# Patient Record
Sex: Male | Born: 1990 | Race: White | Hispanic: No | Marital: Single | Smoking: Never smoker
Health system: Southern US, Community
[De-identification: ages and names within clinical notes are randomized; demographics above are authoritative.]

## PROBLEM LIST (undated history)

## (undated) DIAGNOSIS — N471 Phimosis: Secondary | ICD-10-CM

## (undated) DIAGNOSIS — F419 Anxiety disorder, unspecified: Secondary | ICD-10-CM

## (undated) DIAGNOSIS — J4530 Mild persistent asthma, uncomplicated: Secondary | ICD-10-CM

## (undated) DIAGNOSIS — K219 Gastro-esophageal reflux disease without esophagitis: Secondary | ICD-10-CM

## (undated) HISTORY — PX: NO PAST SURGERIES: SHX2092

---

## 2015-09-21 ENCOUNTER — Encounter (HOSPITAL_COMMUNITY): Payer: Self-pay | Admitting: Psychology

## 2015-09-22 ENCOUNTER — Other Ambulatory Visit (HOSPITAL_COMMUNITY): Payer: BC Managed Care – PPO | Attending: Psychiatry

## 2015-09-23 ENCOUNTER — Other Ambulatory Visit (HOSPITAL_COMMUNITY): Payer: BC Managed Care – PPO

## 2015-09-24 ENCOUNTER — Telehealth (HOSPITAL_COMMUNITY): Payer: Self-pay | Admitting: Psychology

## 2015-09-24 ENCOUNTER — Other Ambulatory Visit (HOSPITAL_COMMUNITY): Payer: BC Managed Care – PPO

## 2015-09-24 NOTE — Progress Notes (Signed)
Craig Wiley is a 24 y.o. male patient. Orientation to CD-IOP: the patient is a 24 yo single, white, male seeking entry into the CD-IOP. The patient is currently living with his mother, step-father and 25 yo step-sister here in [place]. He recently moved here from [place], [place] where he has lived with his father for a number of years. 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 He was prescribed Concerta for ADD at that same age, but stated that he took it rarely. The patient explained he has struggled with anxiety for much of his life and the prescribed stimulant only made the anxiety worse.  The patient used cannabis daily (Cannabis is readily available for medicinal use in [place]) and binge drank at least 3-4 times per week. His problems escalated after he was prescribed Ativan for the anxiety at age 24. At one point, the patient was prescribed 8 mg of Ativan, QID, but somehow the script was mistakenly increased up at 16 mgs. The patient has been hospitalized twice for alcohol detox over the years and most recently, entered a facility in [place] to complete a benzodiazepine detox. [facility] on October 17th and was discharged 8 days later. The patient admitted he has taken about 4 Valium since late October, but has remained free of alcohol and cannabis since before entering [facility]. When asked about efforts to stop drinking and drugging, the patient reported he had stopped drinking for 2 months about a year ago. When questioned further, he admitted that his use of cannabis had increased markedly during that time. The patient has experienced many negative consequences as a result of his alcohol and drug use, including flunking out of school numerous times, losing jobs because of his drug use as well as ongoing problems with his family. He also has a pending DWI from over 2 years ago. The patient reported he had  blacked out for almost 24 hours after taking 4 mg of Ativan and woke up in jail with no memory of what he had done. Addiction is prevalent on both sides of his family. The patient's father is an alcoholic and addict as are numerous paternal uncles and his paternal grandmother. His maternal grandfather and great grandfather were also alcoholics. The patient's parent's divorced when he was 24 years old. He lived with his mother for most of his youth, but has been living with his father in [place] for a number of years. The patient attributes his growing use of chemicals to the anxiety that has followed him for much of his life. He is currently prescribed Celexa and Neurontin, but reported he still feels very anxious. He displayed good motivation and a willingness to do what the program asks of him. His mother and step-father are supportive of his efforts to remain drug-free. The documentation was reviewed, signed and completed accordingly. The patient will return tomorrow and begin the CD-IOP.         [name], LCAS

## 2015-09-24 NOTE — Progress Notes (Signed)
Catalina LungerMatthew Chouimard is a 24 y.o. male patient. CD-IOP: The patient was scheduled to begin the program today, but appears to be resisting treatment. He and his mother both contacted me this morning and I spoke with him and left message for her. 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 We will welcome him when he appears.         Khristian Phillippi, LCAS

## 2015-09-27 ENCOUNTER — Other Ambulatory Visit (HOSPITAL_COMMUNITY): Payer: BC Managed Care – PPO | Admitting: Medical

## 2015-09-28 ENCOUNTER — Other Ambulatory Visit (HOSPITAL_COMMUNITY): Payer: BC Managed Care – PPO

## 2015-09-29 ENCOUNTER — Other Ambulatory Visit (HOSPITAL_COMMUNITY): Payer: BC Managed Care – PPO

## 2015-09-30 ENCOUNTER — Other Ambulatory Visit (HOSPITAL_COMMUNITY): Payer: BC Managed Care – PPO

## 2015-10-01 ENCOUNTER — Other Ambulatory Visit (HOSPITAL_COMMUNITY): Payer: BC Managed Care – PPO

## 2015-10-04 ENCOUNTER — Other Ambulatory Visit (HOSPITAL_COMMUNITY): Payer: BC Managed Care – PPO

## 2015-10-05 ENCOUNTER — Other Ambulatory Visit (HOSPITAL_COMMUNITY): Payer: BC Managed Care – PPO

## 2015-10-06 ENCOUNTER — Encounter (HOSPITAL_COMMUNITY): Payer: Self-pay

## 2015-10-06 ENCOUNTER — Other Ambulatory Visit (HOSPITAL_COMMUNITY): Payer: BC Managed Care – PPO

## 2015-10-06 ENCOUNTER — Emergency Department (HOSPITAL_COMMUNITY): Payer: BC Managed Care – PPO

## 2015-10-06 ENCOUNTER — Emergency Department (HOSPITAL_COMMUNITY)
Admission: EM | Admit: 2015-10-06 | Discharge: 2015-10-06 | Disposition: A | Discharge status: Home / Self Care | Payer: BC Managed Care – PPO | Attending: Emergency Medicine | Admitting: Emergency Medicine

## 2015-10-06 DIAGNOSIS — F17200 Nicotine dependence, unspecified, uncomplicated: Secondary | ICD-10-CM | POA: Diagnosis not present

## 2015-10-06 DIAGNOSIS — Z7951 Long term (current) use of inhaled steroids: Secondary | ICD-10-CM | POA: Diagnosis not present

## 2015-10-06 DIAGNOSIS — Z79899 Other long term (current) drug therapy: Secondary | ICD-10-CM | POA: Insufficient documentation

## 2015-10-06 DIAGNOSIS — F419 Anxiety disorder, unspecified: Secondary | ICD-10-CM | POA: Insufficient documentation

## 2015-10-06 DIAGNOSIS — R0989 Other specified symptoms and signs involving the circulatory and respiratory systems: Secondary | ICD-10-CM | POA: Diagnosis present

## 2015-10-06 DIAGNOSIS — J45901 Unspecified asthma with (acute) exacerbation: Secondary | ICD-10-CM | POA: Insufficient documentation

## 2015-10-06 HISTORY — DX: Anxiety disorder, unspecified: F41.9

## 2015-10-06 MED ORDER — IPRATROPIUM-ALBUTEROL 0.5-2.5 (3) MG/3ML IN SOLN
3.0000 mL | Freq: Once | RESPIRATORY_TRACT | Status: AC
  Administered 2015-10-06: 3 mL via RESPIRATORY_TRACT
  Filled 2015-10-06: qty 3

## 2015-10-06 MED ORDER — ALBUTEROL SULFATE (2.5 MG/3ML) 0.083% IN NEBU
5.0000 mg | INHALATION_SOLUTION | Freq: Once | RESPIRATORY_TRACT | Status: DC
  Filled 2015-10-06: qty 6

## 2015-10-06 MED ORDER — DEXAMETHASONE 4 MG PO TABS
10.0000 mg | ORAL_TABLET | Freq: Once | ORAL | Status: AC
  Administered 2015-10-06: 10 mg via ORAL
  Filled 2015-10-06: qty 2

## 2015-10-06 NOTE — ED Provider Notes (Signed)
CSN: [id number]     Arrival date & time 10/06/15  1920 History   First MD Initiated Contact with Patient 10/06/15 2109     Chief Complaint  Patient presents with  . Asthma     (Consider location/radiation/quality/duration/timing/severity/associated sxs/prior Treatment) HPI Comments: 24 year old male with history of asthma and anxiety who presents with asthma exacerbation. Patient states that he began having upper respiratory infection symptoms one to 2 weeks ago including runny nose and sore throat. No cough. He has had 2 weeks of worsening asthma symptoms despite using Symbicort and albuterol. He reports that this afternoon he became more short of breath while trying to exercise. Mom notes that he has had problems with allergies since being in [place]  for a visit. He endorses chest tightness which he has had with previous asthma exacerbations. No fevers, vomiting, or abdominal pain.  Patient is a 24 y.o. male presenting with asthma. The history is provided by the patient.  Asthma    Past Medical History  Diagnosis Date  . Asthma   . Anxiety    History reviewed. No pertinent past surgical history. History reviewed. No pertinent family history. Social History  Substance Use Topics  . Smoking status: Current Some Day Smoker  . Smokeless tobacco: None  . Alcohol Use: No    Review of Systems  10 Systems reviewed and are negative for acute change except as noted in the HPI.   Allergies  Review of patient's allergies indicates no known allergies.  Home Medications   Prior to Admission medications   Medication Sig Start Date End Date Taking? Authorizing Provider  albuterol (PROVENTIL HFA;VENTOLIN HFA) 108 (90 BASE) MCG/ACT inhaler Inhale 2 puffs into the lungs every 6 (six) hours as needed for wheezing or shortness of breath.   Yes Historical Provider, MD  budesonide-formoterol (SYMBICORT) 160-4.5 MCG/ACT inhaler Inhale 2 puffs into the lungs 2 (two) times daily.   Yes  Historical Provider, MD  diazepam (VALIUM) 10 MG tablet Take 10 mg by mouth every 6 (six) hours as needed for anxiety.   Yes Historical Provider, MD  escitalopram (LEXAPRO) 20 MG tablet Take 20 mg by mouth daily.   Yes Historical Provider, MD  gabapentin (NEURONTIN) 100 MG capsule Take 100 mg by mouth daily. Take 1 tablet (100 mg) along with 400 mg tablet to=500 mg around noon.   Yes Historical Provider, MD  gabapentin (NEURONTIN) 400 MG capsule Take 400-800 mg by mouth 3 (three) times daily. Take 2 tablets (800 mg) in the morning, Take 1 tablet (400 mg) around noon along with 100 mg tablet to=500 mg and Take 2 (800 mg) in the evening.   Yes Historical Provider, MD   BP 125/64 mmHg  Pulse 83  Temp(Src) 98 F (36.7 C) (Oral)  Resp 18  Ht 5\' 9"  (1.753 m)  Wt 210 lb (95.255 kg)  BMI 31.00 kg/m2  SpO2 100% Physical Exam  Constitutional: He is oriented to person, place, and time. He appears well-developed and well-nourished. No distress.  HENT:  Head: Normocephalic and atraumatic.  Moist mucous membranes  Eyes: Conjunctivae are normal. Pupils are equal, round, and reactive to light.  Neck: Neck supple.  Cardiovascular: Normal rate, regular rhythm and normal heart sounds.   No murmur heard. Pulmonary/Chest: Effort normal.  Mildly diminished BS b/l, no obvious wheezing  Abdominal: Soft. Bowel sounds are normal. He exhibits no distension. There is no tenderness.  Musculoskeletal: He exhibits no edema.  Neurological: He is alert and oriented to person, place,  and time.  Fluent speech  Skin: Skin is warm and dry.  Psychiatric: He has a normal mood and affect. Judgment normal.  Nursing note and vitals reviewed.   ED Course  Procedures (including critical care time) Labs Review Labs Reviewed - No data to display  Imaging Review Dg Chest 2 View  10/06/2015  CLINICAL DATA:  Increasing shortness of breath for 2 weeks. EXAM: CHEST  2 VIEW COMPARISON:  None. FINDINGS: The heart size and  mediastinal contours are within normal limits. Both lungs are clear. The visualized skeletal structures are unremarkable. IMPRESSION: No active cardiopulmonary disease. Electronically Signed   By: Elige Ko   On: 10/06/2015 19:57   Medications  albuterol (PROVENTIL) (2.5 MG/3ML) 0.083% nebulizer solution 5 mg (5 mg Nebulization Not Given 10/06/15 2154)  ipratropium-albuterol (DUONEB) 0.5-2.5 (3) MG/3ML nebulizer solution 3 mL (3 mLs Nebulization Given 10/06/15 2131)  dexamethasone (DECADRON) tablet 10 mg (10 mg Oral Given 10/06/15 2130)     MDM   Final diagnoses:  Asthma exacerbation    Pt p/w 2 weeks of asthma sx in setting of recent mild URI. On exam, he was well-appearing with normal work of breathing and normal vital signs. I heard no obvious wheezing but he did have diminished breath sounds bilaterally. Because of the persistence of his sx which are consistent with previous asthma exacerbations, gave him a dose of Decadron as well as a DuoNeb. Chest x-ray negative. Discussed supportive care including continued albuterol. He already has a follow-up appointment with a pulmonologist soon. Father inquired whether anxiety may play a component in his shortness of breath and I suspect that it may. Instructed to follow-up with PCP regarding anxiety and to start allergy medication while in [place]. Patient discharged in satisfactory condition.  Laurence Spates, MD 10/06/15 [phone]

## 2015-10-06 NOTE — ED Notes (Signed)
Pt complains of an asthma exacerbation for two weeks, inhalers work temporarily

## 2015-10-06 NOTE — Discharge Instructions (Signed)
Asthma, Adult Asthma is a recurring condition in which the airways tighten and narrow. Asthma can make it difficult to breathe. It can cause coughing, wheezing, and shortness of breath. Asthma episodes, also called asthma attacks, range from minor to life-threatening. Asthma cannot be cured, but medicines and lifestyle changes can help control it. CAUSES Asthma is believed to be caused by inherited (genetic) and environmental factors, but its exact cause is unknown. Asthma may be triggered by allergens, lung infections, or irritants in the air. Asthma triggers are different for each person. Common triggers include:   Animal dander.  Dust mites.  Cockroaches.  Pollen from trees or grass.  Mold.  Smoke.  Air pollutants such as dust, household cleaners, hair sprays, aerosol sprays, paint fumes, strong chemicals, or strong odors.  Cold air, weather changes, and winds (which increase molds and pollens in the air).  Strong emotional expressions such as crying or laughing hard.  Stress.  Certain medicines (such as aspirin) or types of drugs (such as beta-blockers).  Sulfites in foods and drinks. Foods and drinks that may contain sulfites include dried fruit, potato chips, and sparkling grape juice.  Infections or inflammatory conditions such as the flu, a cold, or an inflammation of the nasal membranes (rhinitis).  Gastroesophageal reflux disease (GERD).  Exercise or strenuous activity. SYMPTOMS Symptoms may occur immediately after asthma is triggered or many hours later. Symptoms include:  Wheezing.  Excessive nighttime or early morning coughing.  Frequent or severe coughing with a common cold.  Chest tightness.  Shortness of breath. DIAGNOSIS  The diagnosis of asthma is made by a review of your medical history and a physical exam. Tests may also be performed. These may include:  Lung function studies. These tests show how much air you breathe in and out.  Allergy  tests.  Imaging tests such as X-rays. TREATMENT  Asthma cannot be cured, but it can usually be controlled. Treatment involves identifying and avoiding your asthma triggers. It also involves medicines. There are 2 classes of medicine used for asthma treatment:   Controller medicines. These prevent asthma symptoms from occurring. They are usually taken every day.  Reliever or rescue medicines. These quickly relieve asthma symptoms. They are used as needed and provide short-term relief. Your health care provider will help you create an asthma action plan. An asthma action plan is a written plan for managing and treating your asthma attacks. It includes a list of your asthma triggers and how they may be avoided. It also includes information on when medicines should be taken and when their dosage should be changed. An action plan may also involve the use of a device called a peak flow meter. A peak flow meter measures how well the lungs are working. It helps you monitor your condition. HOME CARE INSTRUCTIONS   Take medicines only as directed by your health care provider. Speak with your health care provider if you have questions about how or when to take the medicines.  Use a peak flow meter as directed by your health care provider. Record and keep track of readings.  Understand and use the action plan to help minimize or stop an asthma attack without needing to seek medical care.  Control your home environment in the following ways to help prevent asthma attacks:  Do not smoke. Avoid being exposed to secondhand smoke.  Change your heating and air conditioning filter regularly.  Limit your use of fireplaces and wood stoves.  Get rid of pests (such as roaches   and mice) and their droppings.  Throw away plants if you see mold on them.  Clean your floors and dust regularly. Use unscented cleaning products.  Try to have someone else vacuum for you regularly. Stay out of rooms while they are  being vacuumed and for a short while afterward. If you vacuum, use a dust mask from a hardware store, a double-layered or microfilter vacuum cleaner bag, or a vacuum cleaner with a HEPA filter.  Replace carpet with wood, tile, or vinyl flooring. Carpet can trap dander and dust.  Use allergy-proof pillows, mattress covers, and box spring covers.  Wash bed sheets and blankets every week in hot water and dry them in a dryer.  Use blankets that are made of polyester or cotton.  Clean bathrooms and kitchens with bleach. If possible, have someone repaint the walls in these rooms with mold-resistant paint. Keep out of the rooms that are being cleaned and painted.  Wash hands frequently. SEEK MEDICAL CARE IF:   You have wheezing, shortness of breath, or a cough even if taking medicine to prevent attacks.  The colored mucus you cough up (sputum) is thicker than usual.  Your sputum changes from clear or white to yellow, green, gray, or bloody.  You have any problems that may be related to the medicines you are taking (such as a rash, itching, swelling, or trouble breathing).  You are using a reliever medicine more than 2-3 times per week.  Your peak flow is still at 50-79% of your personal best after following your action plan for 1 hour.  You have a fever. SEEK IMMEDIATE MEDICAL CARE IF:   You seem to be getting worse and are unresponsive to treatment during an asthma attack.  You are short of breath even at rest.  You get short of breath when doing very Floris Neuhaus physical activity.  You have difficulty eating, drinking, or talking due to asthma symptoms.  You develop chest pain.  You develop a fast heartbeat.  You have a bluish color to your lips or fingernails.  You are light-headed, dizzy, or faint.  Your peak flow is less than 50% of your personal best.   This information is not intended to replace advice given to you by your health care provider. Make sure you discuss any  questions you have with your health care provider.   Document Released: 10/02/2005 Document Revised: 06/23/2015 Document Reviewed: 05/01/2013 Elsevier Interactive Patient Education 2016 Elsevier Inc.  

## 2015-10-07 ENCOUNTER — Other Ambulatory Visit (HOSPITAL_COMMUNITY): Payer: BC Managed Care – PPO

## 2015-10-08 ENCOUNTER — Other Ambulatory Visit (HOSPITAL_COMMUNITY): Payer: BC Managed Care – PPO

## 2015-10-12 ENCOUNTER — Other Ambulatory Visit (HOSPITAL_COMMUNITY): Payer: BC Managed Care – PPO

## 2015-10-13 ENCOUNTER — Other Ambulatory Visit (HOSPITAL_COMMUNITY): Payer: BC Managed Care – PPO

## 2015-10-14 ENCOUNTER — Encounter: Payer: Self-pay | Admitting: Internal Medicine

## 2015-10-14 ENCOUNTER — Ambulatory Visit (INDEPENDENT_AMBULATORY_CARE_PROVIDER_SITE_OTHER): Payer: BC Managed Care – PPO | Admitting: Internal Medicine

## 2015-10-14 ENCOUNTER — Other Ambulatory Visit (INDEPENDENT_AMBULATORY_CARE_PROVIDER_SITE_OTHER): Payer: BC Managed Care – PPO

## 2015-10-14 ENCOUNTER — Other Ambulatory Visit (HOSPITAL_COMMUNITY): Payer: BC Managed Care – PPO

## 2015-10-14 VITALS — BP 112/70 | HR 79 | Ht 69.0 in | Wt 218.2 lb

## 2015-10-14 DIAGNOSIS — J4530 Mild persistent asthma, uncomplicated: Secondary | ICD-10-CM | POA: Diagnosis not present

## 2015-10-14 LAB — CBC WITH DIFFERENTIAL/PLATELET
BASOS PCT: 0.4 % (ref 0.0–3.0)
Basophils Absolute: 0 10*3/uL (ref 0.0–0.1)
EOS ABS: 0.3 10*3/uL (ref 0.0–0.7)
EOS PCT: 5.5 % — AB (ref 0.0–5.0)
HEMATOCRIT: 45 % (ref 39.0–52.0)
HEMOGLOBIN: 15 g/dL (ref 13.0–17.0)
LYMPHS PCT: 31.1 % (ref 12.0–46.0)
Lymphs Abs: 1.7 10*3/uL (ref 0.7–4.0)
MCHC: 33.2 g/dL (ref 30.0–36.0)
MCV: 89.1 fl (ref 78.0–100.0)
Monocytes Absolute: 0.8 10*3/uL (ref 0.1–1.0)
Monocytes Relative: 14.9 % — ABNORMAL HIGH (ref 3.0–12.0)
NEUTROS ABS: 2.6 10*3/uL (ref 1.4–7.7)
Neutrophils Relative %: 48.1 % (ref 43.0–77.0)
PLATELETS: 195 10*3/uL (ref 150.0–400.0)
RBC: 5.06 Mil/uL (ref 4.22–5.81)
RDW: 12.9 % (ref 11.5–15.5)
WBC: 5.4 10*3/uL (ref 4.0–10.5)

## 2015-10-14 MED ORDER — BUDESONIDE-FORMOTEROL FUMARATE 160-4.5 MCG/ACT IN AERO: INHALATION_SPRAY | RESPIRATORY_TRACT | Status: DC

## 2015-10-14 NOTE — Patient Instructions (Addendum)
Symbicort  160 Take 2 puffs first thing in am and then another 2 puffs about 12 hours later.   Only use your albuterol as a rescue medication to be used if you can't catch your breath by resting or doing a relaxed purse lip breathing pattern.  - The less you use it, the better it will work when you need it. - Ok to use up to 2 puffs  every 4 hours if you must but call for immediate appointment if use goes up over your usual need - Don't leave home without it !!  (think of it like the spare tire for your car)   Please remember to go to the lab  department downstairs for your tests - we will call you with the results when they are available.  Please schedule a follow up office visit in 4 weeks, sooner if needed

## 2015-10-14 NOTE — Progress Notes (Signed)
   Subjective:    Patient ID: Craig Wiley, male    DOB: 01/18/1991, 24 y.o.   MRN: [id number]  HPI  24 yowm occ smokes MJ born 1 m premature and 4 days in nursery s vent or 02 then "croup" age 89 /recurrent cough and wheeze finally labeled as asthma and well controlled on prn saba flared with colds but since 07/2015 much more persistent symptoms some better on symbicort but only uses one bid and referred to pulmonary clinic 10/14/2015 by Satira SarkAron Morrow for asthma eval  10/15/2015 1st [facility] office visit/ Craig Wiley  On symb 80 one bid avg saba 2x daily  Chief Complaint  Patient presents with  . Pulmonary Consult    Referred by Dr. Kateri PlummerMorrow. Pt states dxed with Dr. Kateri PlummerMorrow. Pt states dxed with Asthma at age 89. He c/o chest tightness and SOB for the past year, worse for the past 2 months. He states that his symptoms are present at all times, but worse with exertion. He has hx of hospitalized at age 89 and [age] for his asthma.   No obvious other patterns in day to day or daytime variabilty or assoc chronic cough or cp or chest tightness, subjective wheeze or overt sinus  symptoms. No unusual exp hx or h/o childhood pna/ asthma or knowledge of premature birth.   Sleeping ok without nocturnal  or early am exacerbation  of respiratory  c/o's or need for noct saba. Also denies any obvious fluctuation of symptoms with weather or environmental changes or other aggravating or alleviating factors except as outlined above   Current Medications, Allergies, Complete Past Medical History, Past Surgical History, Family History, and Social History were reviewed in [facility] electronic medical record.             Review of Systems  Constitutional: Negative for fever, chills, activity change, appetite change and unexpected weight change.  HENT: Positive for dental problem. Negative for congestion, postnasal drip, rhinorrhea, sneezing, sore throat, trouble swallowing and voice change.   Eyes: Negative for  visual disturbance.  Respiratory: Positive for chest tightness and shortness of breath. Negative for cough and choking.   Cardiovascular: Negative for chest pain and leg swelling.  Gastrointestinal: Negative for nausea, vomiting and abdominal pain.  Genitourinary: Negative for difficulty urinating.       Acid heartburn  Musculoskeletal: Positive for arthralgias.  Skin: Negative for rash.  Psychiatric/Behavioral: Negative for behavioral problems and confusion.       Objective:   Physical Exam  amb wm nad  Wt Readings from Last 3 Encounters:  10/14/15 218 lb 3.2 oz (98.975 kg)    Vital signs reviewed  HEENT: nl dentition, turbinates, and oropharynx. Nl external ear canals without cough reflex   NECK :  without JVD/Nodes/TM/ nl carotid upstrokes bilaterally   LUNGS: no acc muscle use,  Nl contour chest with trace wheeze bilaterally of fvc maneuver    CV:  RRR  no s3 or murmur or increase in P2, no edema   ABD:  soft and nontender with nl inspiratory excursion in the supine position. No bruits or organomegaly, bowel sounds nl  MS:  Nl gait/ ext warm without deformities, calf tenderness, cyanosis or clubbing No obvious joint restrictions   SKIN: warm and dry without lesions    NEURO:  alert, approp, nl sensorium with  no motor deficits    Labs ordered 10/14/2015  Cbc with diff/ allergy profile      Assessment & Plan:

## 2015-10-15 ENCOUNTER — Other Ambulatory Visit (HOSPITAL_COMMUNITY): Payer: BC Managed Care – PPO

## 2015-10-15 ENCOUNTER — Encounter: Payer: Self-pay | Admitting: Internal Medicine

## 2015-10-15 LAB — ALLERGY FULL PROFILE
ALTERNARIA ALTERNATA: 6.43 kU/L — AB
Allergen, D pternoyssinus,d7: 0.36 kU/L — ABNORMAL HIGH
Allergen,Goose feathers, e70: <0.1 kU/L
Aspergillus fumigatus, m3: 0.12 kU/L — ABNORMAL HIGH
Bahia Grass: <0.1 kU/L
Bermuda Grass: <0.1 kU/L
Box Elder IgE: <0.1 kU/L
CANDIDA ALBICANS: 0.15 kU/L — AB
CAT DANDER: 0.38 kU/L — AB
COMMON RAGWEED: 3.74 kU/L — AB
CURVULARIA LUNATA: 0.57 kU/L — AB
D. farinae: 0.13 kU/L — ABNORMAL HIGH
Dog Dander: 0.21 kU/L — ABNORMAL HIGH
Elm IgE: <0.1 kU/L
G005 Rye, Perennial: <0.1 kU/L
G009 Red Top: <0.1 kU/L
Goldenrod: <0.1 kU/L
HELMINTHOSPORIUM HALODES: 0.76 kU/L — AB
House Dust Hollister: <0.1 kU/L
IGE (IMMUNOGLOBULIN E), SERUM: 52 kU/L (ref <115)
Oak: 1 kU/L — ABNORMAL HIGH
STEMPHYLIUM BOTRYOSUM: 2.37 kU/L — AB
Sycamore Tree: <0.1 kU/L

## 2015-10-15 NOTE — Assessment & Plan Note (Addendum)
10/14/2015  extensive coaching HFA effectiveness =    90% > increase symbicort to 160 2bid  - allergy profile 10/14/2015 >  Eos 0.3   DDX of  difficult airways management all start with A and  include Adherence, Ace Inhibitors, Acid Reflux, Active Sinus Disease, Alpha 1 Antitripsin deficiency, Anxiety masquerading as Airways dz,  ABPA,  allergy(esp in young), Aspiration (esp in elderly), Adverse effects of meds,  Active smokers, A bunch of PE's (a small clot burden can't cause this syndrome unless there is already severe underlying pulm or vascular dz with poor reserve) plus two Bs  = Bronchiectasis and Beta blocker use..and one C= CHF    Adherence is always the initial "prime suspect" and is a multilayered concern that requires a "trust but verify" approach in every patient - starting with knowing how to use medications, especially inhalers, correctly, keeping up with refills and understanding the fundamental difference between maintenance and prns vs those medications only taken for a very short course and then stopped and not refilled.  - The proper method of use, as well as anticipated side effects, of a metered-dose inhaler are discussed and demonstrated to the patient. Improved effectiveness after extensive coaching during this visit to a level of approximately 90 % from a baseline of 50 %  ? Allergy > cats in house but not in bedroom > allergy profile ordered  ? gerd > diet advised, consider adding acid suppression maint rx depending on resp to rx  I had an extended discussion with the patient and mom  reviewing all relevant studies completed to date and  lasting 35  minutes of a 60 minute visit    Goal of 2x alb /week and 2 refills per year reviewed/ along with guidelines for step up therapy if goal not reached  Each maintenance medication was reviewed in detail including most importantly the difference between maintenance and prns and under what circumstances the prns are to be triggered  using an action plan format that is not reflected in the computer generated alphabetically organized AVS.    Please see instructions for details which were reviewed in writing and the patient given a copy highlighting the part that I personally wrote and discussed at today's ov.

## 2015-10-19 ENCOUNTER — Other Ambulatory Visit (HOSPITAL_COMMUNITY): Payer: BC Managed Care – PPO

## 2015-10-20 ENCOUNTER — Other Ambulatory Visit (HOSPITAL_COMMUNITY): Payer: BC Managed Care – PPO

## 2015-10-21 ENCOUNTER — Ambulatory Visit (HOSPITAL_COMMUNITY): Payer: BC Managed Care – PPO | Admitting: Psychiatry

## 2015-10-21 ENCOUNTER — Other Ambulatory Visit (HOSPITAL_COMMUNITY): Payer: BC Managed Care – PPO

## 2015-10-22 ENCOUNTER — Other Ambulatory Visit (HOSPITAL_COMMUNITY): Payer: BC Managed Care – PPO

## 2015-10-25 ENCOUNTER — Other Ambulatory Visit (HOSPITAL_COMMUNITY): Payer: BC Managed Care – PPO

## 2015-10-26 ENCOUNTER — Other Ambulatory Visit (HOSPITAL_COMMUNITY): Payer: BC Managed Care – PPO

## 2015-10-27 ENCOUNTER — Other Ambulatory Visit (HOSPITAL_COMMUNITY): Payer: BC Managed Care – PPO

## 2015-10-28 ENCOUNTER — Other Ambulatory Visit (HOSPITAL_COMMUNITY): Payer: BC Managed Care – PPO

## 2015-10-29 ENCOUNTER — Other Ambulatory Visit (HOSPITAL_COMMUNITY): Payer: BC Managed Care – PPO

## 2015-11-01 ENCOUNTER — Other Ambulatory Visit (HOSPITAL_COMMUNITY): Payer: BC Managed Care – PPO

## 2015-11-02 ENCOUNTER — Other Ambulatory Visit (HOSPITAL_COMMUNITY): Payer: BC Managed Care – PPO

## 2015-11-03 ENCOUNTER — Other Ambulatory Visit (HOSPITAL_COMMUNITY): Payer: BC Managed Care – PPO

## 2015-11-04 ENCOUNTER — Other Ambulatory Visit (HOSPITAL_COMMUNITY): Payer: BC Managed Care – PPO

## 2015-11-05 ENCOUNTER — Other Ambulatory Visit (HOSPITAL_COMMUNITY): Payer: BC Managed Care – PPO

## 2015-11-08 ENCOUNTER — Other Ambulatory Visit (HOSPITAL_COMMUNITY): Payer: BC Managed Care – PPO

## 2015-11-09 ENCOUNTER — Other Ambulatory Visit (HOSPITAL_COMMUNITY): Payer: BC Managed Care – PPO

## 2015-11-10 ENCOUNTER — Other Ambulatory Visit (HOSPITAL_COMMUNITY): Payer: BC Managed Care – PPO

## 2015-11-11 ENCOUNTER — Other Ambulatory Visit (HOSPITAL_COMMUNITY): Payer: BC Managed Care – PPO

## 2015-11-12 ENCOUNTER — Ambulatory Visit (INDEPENDENT_AMBULATORY_CARE_PROVIDER_SITE_OTHER): Payer: BC Managed Care – PPO | Admitting: Internal Medicine

## 2015-11-12 ENCOUNTER — Other Ambulatory Visit (HOSPITAL_COMMUNITY): Payer: BC Managed Care – PPO

## 2015-11-12 ENCOUNTER — Encounter (INDEPENDENT_AMBULATORY_CARE_PROVIDER_SITE_OTHER): Payer: Self-pay

## 2015-11-12 ENCOUNTER — Encounter: Payer: Self-pay | Admitting: Internal Medicine

## 2015-11-12 VITALS — BP 122/78 | HR 58 | Ht 69.0 in | Wt 227.2 lb

## 2015-11-12 DIAGNOSIS — J4530 Mild persistent asthma, uncomplicated: Secondary | ICD-10-CM

## 2015-11-12 MED ORDER — BUDESONIDE-FORMOTEROL FUMARATE 160-4.5 MCG/ACT IN AERO: 2.0000 | INHALATION_SPRAY | Freq: Two times a day (BID) | RESPIRATORY_TRACT | Status: DC

## 2015-11-12 NOTE — Patient Instructions (Signed)
No change in recs  Please schedule a follow up visit in 3 months but call sooner if needed with pfts

## 2015-11-12 NOTE — Assessment & Plan Note (Addendum)
10/14/2015  extensive coaching HFA effectiveness =    90% > increase symbicort to 160 2bid  - allergy profile 10/14/2015 >  Eos 0.3 /  IgE  52  Ragweed > cat > dog   All goals of chronic asthma control met including optimal function clinically ( still needs pfts for baseline as born prematurely) and elimination of symptoms with minimal need for rescue therapy.  Contingencies discussed in full including contacting this office immediately if not controlling the symptoms using the rule of two's.     I had an extended discussion with the patient reviewing all relevant studies completed to date and  lasting 15 to 20 minutes of a 25 minute visit    Each maintenance medication was reviewed in detail including most importantly the difference between maintenance and prns and under what circumstances the prns are to be triggered using an action plan format that is not reflected in the computer generated alphabetically organized AVS.    Please see instructions for details which were reviewed in writing and the patient given a copy highlighting the part that I personally wrote and discussed at today's ov.

## 2015-11-12 NOTE — Progress Notes (Addendum)
Subjective:    Patient ID: Craig Wiley, male    DOB: 06-30-1991, 25 y.o.   MRN: [id number]    Brief patient profile:  24 yowm occ smokes MJ born 1 m premature and 4 days in nursery s vent or 02 then "croup" age 52 /recurrent cough and wheeze finally labeled as asthma and well controlled on prn saba flared with colds but since 07/2015 much more persistent symptoms some better on symbicort but only uses one bid and referred to pulmonary clinic 10/14/2015 by Satira Sark for asthma eval    History of Present Illness  10/15/2015 1st [facility] office visit/ Craig Wiley  On symb 80 one bid avg saba 2x daily  Chief Complaint  Patient presents with  . Pulmonary Consult    Referred by Dr. Kateri Plummer. Pt states dxed with Dr. Kateri Plummer. Pt states dxed with Asthma at age 83. He c/o chest tightness and SOB for the past year, worse for the past 2 months. He states that his symptoms are present at all times, but worse with exertion. He has hx of hospitalized at age 25 and 34 for his asthma.   rec Symbicort  160 Take 2 puffs first thing in am and then another 2 puffs about 12 hours later.  Only use your albuterol as a rescue medication  Please remember to go to the lab  department     11/12/2015  f/u ov/Tarrell Debes re: chronic asthma on symb 160 2bid and only saba before ex  Chief Complaint  Patient presents with  . Follow-up    Pt states that his breathing is doing well.   just using one pff saba pre ex  Used mj inhaled for anxiety but no longer inhaling any  No obvious day to day or daytime variability or assoc chronic cough or cp or chest tightness, subjective wheeze or overt sinus or hb symptoms. No unusual exp hx or h/o childhood pna   Sleeping ok without nocturnal  or early am exacerbation  of respiratory  c/o's or need for noct saba. Also denies any obvious fluctuation of symptoms with weather or environmental changes or other aggravating or alleviating factors except as outlined above   Current  Medications, Allergies, Complete Past Medical History, Past Surgical History, Family History, and Social History were reviewed in [employer] record.  ROS  The following are not active complaints unless bolded sore throat, dysphagia, dental problems, itching, sneezing,  nasal congestion or excess/ purulent secretions, ear ache,   fever, chills, sweats, unintended wt loss, classically pleuritic or exertional cp, hemoptysis,  orthopnea pnd or leg swelling, presyncope, palpitations, abdominal pain, anorexia, nausea, vomiting, diarrhea  or change in bowel or bladder habits, change in stools or urine, dysuria,hematuria,  rash, arthralgias, visual complaints, headache, numbness, weakness or ataxia or problems with walking or coordination,  change in mood/affect or memory.             Objective:   Physical Exam  amb wm nad   Wt Readings from Last 3 Encounters:  11/12/15 227 lb 3.2 oz (103.057 kg)  10/14/15 218 lb 3.2 oz (98.975 kg)  10/06/15 210 lb (95.255 kg)    Vital signs reviewed        HEENT: nl dentition, turbinates, and oropharynx. Nl external ear canals without cough reflex   NECK :  without JVD/Nodes/TM/ nl carotid upstrokes bilaterally   LUNGS: no acc muscle use,  Nl contour chest completely clear to  A and P  CV:  RRR  no s3 or murmur or increase in P2, no edema   ABD:  soft and nontender with nl inspiratory excursion in the supine position. No bruits or organomegaly, bowel sounds nl  MS:  Nl gait/ ext warm without deformities, calf tenderness, cyanosis or clubbing No obvious joint restrictions   SKIN: warm and dry without lesions    NEURO:  alert, approp, nl sensorium with  no motor deficits          Assessment & Plan:

## 2015-11-15 ENCOUNTER — Other Ambulatory Visit (HOSPITAL_COMMUNITY): Payer: BC Managed Care – PPO

## 2015-11-16 ENCOUNTER — Other Ambulatory Visit (HOSPITAL_COMMUNITY): Payer: BC Managed Care – PPO

## 2016-01-18 ENCOUNTER — Ambulatory Visit (INDEPENDENT_AMBULATORY_CARE_PROVIDER_SITE_OTHER): Payer: BC Managed Care – PPO | Admitting: Internal Medicine

## 2016-01-18 ENCOUNTER — Encounter: Payer: Self-pay | Admitting: Internal Medicine

## 2016-01-18 VITALS — BP 110/78 | HR 82 | Ht 70.0 in | Wt 228.0 lb

## 2016-01-18 DIAGNOSIS — J4530 Mild persistent asthma, uncomplicated: Secondary | ICD-10-CM

## 2016-01-18 LAB — PULMONARY FUNCTION TEST
DL/VA % PRED: 89 %
DL/VA: 4.21 ml/min/mmHg/L
DLCO COR: 30.37 ml/min/mmHg
DLCO cor % pred: 93 %
DLCO unc % pred: 99 %
DLCO unc: 32.03 ml/min/mmHg
FEF 25-75 Post: 3.6 L/sec
FEF 25-75 Pre: 3.57 L/sec
FEF2575-%CHANGE-POST: 0 %
FEF2575-%PRED-PRE: 76 %
FEF2575-%Pred-Post: 76 %
FEV1-%Change-Post: 1 %
FEV1-%PRED-PRE: 95 %
FEV1-%Pred-Post: 96 %
FEV1-POST: 4.41 L
FEV1-PRE: 4.37 L
FEV1FVC-%CHANGE-POST: 0 %
FEV1FVC-%Pred-Pre: 89 %
FEV6-%Change-Post: 1 %
FEV6-%Pred-Post: 107 %
FEV6-%Pred-Pre: 106 %
FEV6-POST: 5.92 L
FEV6-Pre: 5.83 L
FEV6FVC-%PRED-PRE: 101 %
FEV6FVC-%Pred-Post: 101 %
FVC-%CHANGE-POST: 0 %
FVC-%PRED-POST: 106 %
FVC-%Pred-Pre: 106 %
FVC-Post: 5.92 L
FVC-Pre: 5.9 L
POST FEV6/FVC RATIO: 100 %
PRE FEV6/FVC RATIO: 100 %
Post FEV1/FVC ratio: 75 %
Pre FEV1/FVC ratio: 74 %
RV % PRED: 82 %
RV: 1.26 L
TLC % PRED: 101 %
TLC: 7 L

## 2016-01-18 NOTE — Progress Notes (Signed)
Subjective:    Patient ID: Craig Wiley, male    DOB: 1991-09-07, 25 y.o.   MRN: [id number]    Brief patient profile:  24 yowm occ smokes MJ born 1 m premature and 4 days in nursery s vent or 02 then "croup" age 9 /recurrent cough and wheeze finally labeled as asthma and well controlled on prn saba flared with colds but since 07/2015 much more persistent symptoms some better on symbicort but only uses one bid and referred to pulmonary clinic 10/14/2015 by Satira Sark for asthma eval    History of Present Illness  10/15/2015 1st [facility] office visit/ Rielyn Krupinski  On symb 80 one bid avg saba 2x daily  Chief Complaint  Patient presents with  . Pulmonary Consult    Referred by Dr. Kateri Plummer. Pt states dxed with Dr. Kateri Plummer. Pt states dxed with Asthma at age 62. He c/o chest tightness and SOB for the past year, worse for the past 2 months. He states that his symptoms are present at all times, but worse with exertion. He has hx of hospitalized at age 78 and 27 for his asthma.   rec Symbicort  160 Take 2 puffs first thing in am and then another 2 puffs about 12 hours later.  Only use your albuterol as a rescue medication  Please remember to go to the lab  department     11/12/2015  f/u ov/Kali Ambler re: chronic asthma on symb 160 2bid and only saba before ex  Chief Complaint  Patient presents with  . Follow-up    Pt states that his breathing is doing well.   just using one pff saba pre ex  Used mj inhaled for anxiety but no longer inhaling any rec No change in recs Please schedule a follow up visit in 3 months but call sooner if needed with pfts    01/18/2016  f/u ov/Jason Frisbee re:  Chronic asthma maint on symbicort 160 2bid Chief Complaint  Patient presents with  . Follow-up    Had PFT today-No complaints  Only using saba before working out intensely about 9 h p am symb    No obvious day to day or daytime variability or assoc chronic cough or cp or chest tightness, subjective wheeze or overt  sinus or hb symptoms. No unusual exp hx or h/o childhood pna .  Sleeping ok without nocturnal  or early am exacerbation  of respiratory  c/o's or need for noct saba. Also denies any obvious fluctuation of symptoms with weather or environmental changes or other aggravating or alleviating factors except as outlined above   Current Medications, Allergies, Complete Past Medical History, Past Surgical History, Family History, and Social History were reviewed in [employer] record.  ROS  The following are not active complaints unless bolded sore throat, dysphagia, dental problems, itching, sneezing,  nasal congestion or excess/ purulent secretions, ear ache,   fever, chills, sweats, unintended wt loss, classically pleuritic or exertional cp, hemoptysis,  orthopnea pnd or leg swelling, presyncope, palpitations, abdominal pain, anorexia, nausea, vomiting, diarrhea  or change in bowel or bladder habits, change in stools or urine, dysuria,hematuria,  rash, arthralgias, visual complaints, headache, numbness, weakness or ataxia or problems with walking or coordination,  change in mood/affect or memory.             Objective:   Physical Exam  amb muscular  wm nad    01/18/2016         228   11/12/15 227  lb 3.2 oz (103.057 kg)  10/14/15 218 lb 3.2 oz (98.975 kg)  10/06/15 210 lb (95.255 kg)    Vital signs reviewed        HEENT: nl dentition, turbinates, and oropharynx. Nl external ear canals without cough reflex   NECK :  without JVD/Nodes/TM/ nl carotid upstrokes bilaterally   LUNGS: no acc muscle use,  Nl contour chest completely clear to  A and P    CV:  RRR  no s3 or murmur or increase in P2, no edema   ABD:  soft and nontender with nl inspiratory excursion in the supine position. No bruits or organomegaly, bowel sounds nl  MS:  Nl gait/ ext warm without deformities, calf tenderness, cyanosis or clubbing No obvious joint restrictions   SKIN: warm and dry without  lesions    NEURO:  alert, approp, nl sensorium with  no motor deficits                 Assessment & Plan:

## 2016-01-18 NOTE — Patient Instructions (Signed)
Plan A = Automatic = symbicort 160 Take 2 puffs first thing in am and then another 2 puffs about 12 hours later.   Plan B = Backup Only use your albuterol (Proair)  as a rescue medication to be used if you can't catch your breath by resting or doing a relaxed purse lip breathing pattern.  - The less you use it, the better it will work when you need it. - Ok to use up to 2 puffs  every 4 hours if you must but call for appointment if use goes up over your usual need - Don't leave home without it !!  (think of it like the spare tire for your car)   Please schedule a follow up visit in 6  months but call sooner if needed

## 2016-01-18 NOTE — Assessment & Plan Note (Signed)
10/14/2015  extensive coaching HFA effectiveness =    90% > increase symbicort to 160 2bid  - allergy profile 10/14/2015 >  Eos 0.3 /  IgE  52  Ragweed > cat > dog  - PFT's  01/18/2016   wnl including fef 25-75 s rx at all am of pfts     I had an extended discussion with the patient reviewing all relevant studies completed to date and  lasting 15 to 20 minutes of a 25 minute visit on the following ongoing concerns:   1) All goals of chronic asthma control met including optimal function and elimination of symptoms with minimal need for rescue therapy.  Contingencies discussed in full including contacting this office immediately if not controlling the symptoms using the rule of two's.     2) warned even well controlled asthmatics can and do flare and end up in ER so need to monitor use of saba closely   3) Each maintenance medication was reviewed in detail including most importantly the difference between maintenance and as needed and under what circumstances the prns are to be used.  Please see instructions for details which were reviewed in writing and the patient given a copy.

## 2016-01-18 NOTE — Progress Notes (Signed)
PFT done today. 

## 2016-01-19 ENCOUNTER — Other Ambulatory Visit: Payer: Self-pay | Admitting: Urology

## 2016-01-26 ENCOUNTER — Encounter (HOSPITAL_BASED_OUTPATIENT_CLINIC_OR_DEPARTMENT_OTHER): Payer: Self-pay | Admitting: *Deleted

## 2016-01-26 NOTE — Progress Notes (Signed)
NPO AFTER MN WITH EXCEPTION CLEAR LIQUIDS UNTIL 0730.  ARRIVE AT 1215.  NEEDS HG.  WILL TAKE AM MEDS AND INHALER W/ SIPS OF WATER DOS.

## 2016-02-02 ENCOUNTER — Ambulatory Visit (HOSPITAL_BASED_OUTPATIENT_CLINIC_OR_DEPARTMENT_OTHER)
Admission: RE | Admit: 2016-02-02 | Discharge: 2016-02-02 | Disposition: A | Discharge status: Home / Self Care | Payer: BC Managed Care – PPO | Source: Ambulatory Visit | Attending: Urology | Admitting: Urology

## 2016-02-02 ENCOUNTER — Encounter (HOSPITAL_BASED_OUTPATIENT_CLINIC_OR_DEPARTMENT_OTHER): Payer: Self-pay | Admitting: *Deleted

## 2016-02-02 ENCOUNTER — Ambulatory Visit (HOSPITAL_BASED_OUTPATIENT_CLINIC_OR_DEPARTMENT_OTHER): Payer: BC Managed Care – PPO | Admitting: Anesthesiology

## 2016-02-02 ENCOUNTER — Encounter (HOSPITAL_BASED_OUTPATIENT_CLINIC_OR_DEPARTMENT_OTHER)
Admission: RE | Disposition: A | Discharge status: Home / Self Care | Payer: Self-pay | Source: Ambulatory Visit | Attending: Urology

## 2016-02-02 DIAGNOSIS — N471 Phimosis: Secondary | ICD-10-CM | POA: Diagnosis not present

## 2016-02-02 DIAGNOSIS — K219 Gastro-esophageal reflux disease without esophagitis: Secondary | ICD-10-CM | POA: Insufficient documentation

## 2016-02-02 DIAGNOSIS — E669 Obesity, unspecified: Secondary | ICD-10-CM | POA: Diagnosis not present

## 2016-02-02 DIAGNOSIS — Z87891 Personal history of nicotine dependence: Secondary | ICD-10-CM | POA: Insufficient documentation

## 2016-02-02 HISTORY — DX: Mild persistent asthma, uncomplicated: J45.30

## 2016-02-02 HISTORY — DX: Phimosis: N47.1

## 2016-02-02 HISTORY — PX: CIRCUMCISION: SHX1350

## 2016-02-02 HISTORY — DX: Gastro-esophageal reflux disease without esophagitis: K21.9

## 2016-02-02 LAB — POCT HEMOGLOBIN-HEMACUE: HEMOGLOBIN: 15.2 g/dL (ref 13.0–17.0)

## 2016-02-02 SURGERY — CIRCUMCISION, ADULT
Anesthesia: Monitor Anesthesia Care | Site: Penis

## 2016-02-02 MED ORDER — KETOROLAC TROMETHAMINE 30 MG/ML IJ SOLN
INTRAMUSCULAR | Status: DC | PRN
Start: 2016-02-02 — End: 2016-02-02
  Administered 2016-02-02: 30 mg via INTRAVENOUS

## 2016-02-02 MED ORDER — OXYCODONE-ACETAMINOPHEN 5-325 MG PO TABS: 1.0000 | ORAL_TABLET | Freq: Four times a day (QID) | ORAL | Status: DC | PRN

## 2016-02-02 MED ORDER — PROMETHAZINE HCL 25 MG/ML IJ SOLN
6.2500 mg | INTRAMUSCULAR | Status: DC | PRN
  Filled 2016-02-02: qty 1

## 2016-02-02 MED ORDER — MIDAZOLAM HCL 5 MG/5ML IJ SOLN
INTRAMUSCULAR | Status: DC | PRN
Start: 2016-02-02 — End: 2016-02-02
  Administered 2016-02-02 (×2): 2 mg via INTRAVENOUS

## 2016-02-02 MED ORDER — LACTATED RINGERS IV SOLN
INTRAVENOUS | Status: DC
  Administered 2016-02-02: 13:00:00 via INTRAVENOUS
  Filled 2016-02-02: qty 1000

## 2016-02-02 MED ORDER — MIDAZOLAM HCL 2 MG/2ML IJ SOLN
INTRAMUSCULAR | Status: AC
  Filled 2016-02-02: qty 2

## 2016-02-02 MED ORDER — BUPIVACAINE HCL 0.25 % IJ SOLN
INTRAMUSCULAR | Status: DC | PRN
  Administered 2016-02-02: 10 mL

## 2016-02-02 MED ORDER — ONDANSETRON HCL 4 MG/2ML IJ SOLN
INTRAMUSCULAR | Status: AC
  Filled 2016-02-02: qty 2

## 2016-02-02 MED ORDER — FENTANYL CITRATE (PF) 100 MCG/2ML IJ SOLN
INTRAMUSCULAR | Status: AC
  Filled 2016-02-02: qty 2

## 2016-02-02 MED ORDER — DEXAMETHASONE SODIUM PHOSPHATE 10 MG/ML IJ SOLN
INTRAMUSCULAR | Status: AC
  Filled 2016-02-02: qty 1

## 2016-02-02 MED ORDER — PROPOFOL 10 MG/ML IV BOLUS
INTRAVENOUS | Status: DC | PRN
  Administered 2016-02-02: 50 mg via INTRAVENOUS

## 2016-02-02 MED ORDER — FENTANYL CITRATE (PF) 100 MCG/2ML IJ SOLN
INTRAMUSCULAR | Status: DC | PRN
  Administered 2016-02-02 (×2): 50 ug via INTRAVENOUS

## 2016-02-02 MED ORDER — FLUCONAZOLE IN SODIUM CHLORIDE 100-0.9 MG/50ML-% IV SOLN
100.0000 mg | Freq: Once | INTRAVENOUS | Status: AC
  Administered 2016-02-02: 100 mg via INTRAVENOUS
  Filled 2016-02-02 (×2): qty 50

## 2016-02-02 MED ORDER — CLINDAMYCIN PHOSPHATE 900 MG/50ML IV SOLN
900.0000 mg | INTRAVENOUS | Status: AC
  Administered 2016-02-02: 900 mg via INTRAVENOUS
  Filled 2016-02-02: qty 50

## 2016-02-02 MED ORDER — SENNOSIDES-DOCUSATE SODIUM 8.6-50 MG PO TABS: 1.0000 | ORAL_TABLET | Freq: Two times a day (BID) | ORAL | Status: DC

## 2016-02-02 MED ORDER — ONDANSETRON HCL 4 MG/2ML IJ SOLN
INTRAMUSCULAR | Status: DC | PRN
  Administered 2016-02-02: 4 mg via INTRAVENOUS

## 2016-02-02 MED ORDER — PROPOFOL 500 MG/50ML IV EMUL
INTRAVENOUS | Status: DC | PRN
  Administered 2016-02-02: 75 ug/kg/min via INTRAVENOUS

## 2016-02-02 MED ORDER — DEXAMETHASONE SODIUM PHOSPHATE 4 MG/ML IJ SOLN
INTRAMUSCULAR | Status: DC | PRN
  Administered 2016-02-02: 10 mg via INTRAVENOUS

## 2016-02-02 MED ORDER — FENTANYL CITRATE (PF) 100 MCG/2ML IJ SOLN
25.0000 ug | INTRAMUSCULAR | Status: DC | PRN
  Filled 2016-02-02: qty 1

## 2016-02-02 MED ORDER — PROPOFOL 500 MG/50ML IV EMUL
INTRAVENOUS | Status: AC
  Filled 2016-02-02: qty 50

## 2016-02-02 MED ORDER — CLINDAMYCIN PHOSPHATE 900 MG/50ML IV SOLN
INTRAVENOUS | Status: AC
  Filled 2016-02-02: qty 50

## 2016-02-02 MED ORDER — LIDOCAINE HCL (CARDIAC) 20 MG/ML IV SOLN
INTRAVENOUS | Status: DC | PRN
  Administered 2016-02-02: 100 mg via INTRAVENOUS

## 2016-02-02 MED ORDER — LIDOCAINE HCL (PF) 1 % IJ SOLN
INTRAMUSCULAR | Status: DC | PRN
  Administered 2016-02-02: 10 mL

## 2016-02-02 SURGICAL SUPPLY — 35 items
BANDAGE COBAN STERILE 2 (GAUZE/BANDAGES/DRESSINGS) ×3 IMPLANT
BLADE SURG 15 STRL LF DISP TIS (BLADE) ×1 IMPLANT
BLADE SURG 15 STRL SS (BLADE) ×2
BNDG CONFORM 2 STRL LF (GAUZE/BANDAGES/DRESSINGS) ×3 IMPLANT
COVER BACK TABLE 60X90IN (DRAPES) ×3 IMPLANT
COVER MAYO STAND STRL (DRAPES) ×3 IMPLANT
DECANTER SPIKE VIAL GLASS SM (MISCELLANEOUS) IMPLANT
DRAPE LAPAROTOMY 100X72 PEDS (DRAPES) ×3 IMPLANT
DRAPE LAPAROTOMY T 102X78X121 (DRAPES) ×3 IMPLANT
ELECT NEEDLE TIP 2.8 STRL (NEEDLE) ×3 IMPLANT
ELECT REM PT RETURN 9FT ADLT (ELECTROSURGICAL) ×3
ELECTRODE REM PT RTRN 9FT ADLT (ELECTROSURGICAL) ×1 IMPLANT
GAUZE SPONGE 4X4 16PLY XRAY LF (GAUZE/BANDAGES/DRESSINGS) ×3 IMPLANT
GAUZE XEROFORM 1X8 LF (GAUZE/BANDAGES/DRESSINGS) ×3 IMPLANT
GLOVE BIO SURGEON STRL SZ7.5 (GLOVE) ×3 IMPLANT
GLOVE BIOGEL PI IND STRL 7.5 (GLOVE) ×2 IMPLANT
GLOVE BIOGEL PI INDICATOR 7.5 (GLOVE) ×4
GLOVE SURG SS PI 7.5 STRL IVOR (GLOVE) ×3 IMPLANT
GOWN STRL REUS W/ TWL XL LVL3 (GOWN DISPOSABLE) ×1 IMPLANT
GOWN STRL REUS W/TWL LRG LVL3 (GOWN DISPOSABLE) ×3 IMPLANT
GOWN STRL REUS W/TWL XL LVL3 (GOWN DISPOSABLE) ×2
KIT ROOM TURNOVER WOR (KITS) ×3 IMPLANT
MANIFOLD NEPTUNE II (INSTRUMENTS) IMPLANT
NEEDLE HYPO 25X1 1.5 SAFETY (NEEDLE) IMPLANT
NS IRRIG 500ML POUR BTL (IV SOLUTION) ×3 IMPLANT
PACK BASIN DAY SURGERY FS (CUSTOM PROCEDURE TRAY) ×3 IMPLANT
PENCIL BUTTON HOLSTER BLD 10FT (ELECTRODE) ×3 IMPLANT
SPONGE GAUZE 4X4 12PLY (GAUZE/BANDAGES/DRESSINGS) ×3 IMPLANT
SUT VICRYL 4-0 PS2 18IN ABS (SUTURE) ×9 IMPLANT
SYR CONTROL 10ML LL (SYRINGE) IMPLANT
TOWEL OR 17X26 10 PK STRL BLUE (TOWEL DISPOSABLE) ×6 IMPLANT
TRAY DSU PREP LF (CUSTOM PROCEDURE TRAY) ×3 IMPLANT
TUBE CONNECTING 12'X1/4 (SUCTIONS)
TUBE CONNECTING 12X1/4 (SUCTIONS) IMPLANT
WATER STERILE IRR 500ML POUR (IV SOLUTION) IMPLANT

## 2016-02-02 NOTE — Brief Op Note (Signed)
02/02/2016  2:15 PM  PATIENT:  Sampson GoonMatthew Heroux  25 y.o. male  PRE-OPERATIVE DIAGNOSIS:  PHIMOSIS  POST-OPERATIVE DIAGNOSIS:  PHIMOSIS  PROCEDURE:  Procedure(s): CIRCUMCISION ADULT (N/A)  SURGEON:  Surgeon(s) and Role:    * Sebastian Acheheodore Carleena Mires, MD - Primary  PHYSICIAN ASSISTANT:   ASSISTANTS: none   ANESTHESIA:   local and MAC  EBL:  Total I/O In: 100 [I.V.:100] Out: -   BLOOD ADMINISTERED:none  DRAINS: none   LOCAL MEDICATIONS USED:  MARCAINE    and LIDOCAINE   SPECIMEN:  Source of Specimen:  foreskin  DISPOSITION OF SPECIMEN:  PATHOLOGY  COUNTS:  YES  TOURNIQUET:  * No tourniquets in log *  DICTATION: .Other Dictation: Dictation Number [id number]  PLAN OF CARE: Discharge to home after PACU  PATIENT DISPOSITION:  PACU - hemodynamically stable.   Delay start of Pharmacological VTE agent (>24hrs) due to surgical blood loss or risk of bleeding: yes

## 2016-02-02 NOTE — Transfer of Care (Signed)
Immediate Anesthesia Transfer of Care Note  Patient: Craig Wiley  Procedure(s) Performed: Procedure(s): CIRCUMCISION ADULT (N/A)  Patient Location: PACU  Anesthesia Type:MAC  Level of Consciousness: awake, alert  and oriented  Airway & Oxygen Therapy: Patient Spontanous Breathing and Patient connected to nasal cannula oxygen  Post-op Assessment: Report given to RN  Post vital signs: Reviewed and stable  Last Vitals: 112/51, 62, 14, 98%,97.8 Filed Vitals:   02/02/16 1218  BP: 127/53  Pulse: 67  Temp: 36.6 C  Resp: 16    Complications: No apparent anesthesia complications

## 2016-02-02 NOTE — Anesthesia Preprocedure Evaluation (Addendum)
Anesthesia Evaluation  Patient identified by MRN, date of birth, ID band Patient awake    Reviewed: Allergy & Precautions, NPO status , Patient's Chart, lab work & pertinent test results  Airway Mallampati: II  TM Distance: >3 FB Neck ROM: Full    Dental no notable dental hx.    Pulmonary asthma , former smoker,    Pulmonary exam normal breath sounds clear to auscultation       Cardiovascular negative cardio ROS Normal cardiovascular exam Rhythm:Regular Rate:Normal     Neuro/Psych Anxiety negative neurological ROS  negative psych ROS   GI/Hepatic Neg liver ROS, GERD  Medicated,  Endo/Other  negative endocrine ROS  Renal/GU negative Renal ROS  negative genitourinary   Musculoskeletal negative musculoskeletal ROS (+)   Abdominal (+) + obese,   Peds negative pediatric ROS (+)  Hematology negative hematology ROS (+)   Anesthesia Other Findings   Reproductive/Obstetrics negative OB ROS                            Anesthesia Physical Anesthesia Plan  ASA: II  Anesthesia Plan: MAC   Post-op Pain Management:    Induction: Intravenous  Airway Management Planned:   Additional Equipment:   Intra-op Plan:   Post-operative Plan: Extubation in OR  Informed Consent: I have reviewed the patients History and Physical, chart, labs and discussed the procedure including the risks, benefits and alternatives for the proposed anesthesia with the patient or authorized representative who has indicated his/her understanding and acceptance.   Dental advisory given  Plan Discussed with: CRNA  Anesthesia Plan Comments: (Discussed MAC and general. Prefers MAC.)       Anesthesia Quick Evaluation

## 2016-02-02 NOTE — H&P (Signed)
Craig Wiley is an 25 y.o. male.    Chief Complaint: Pre-op Circumcision  HPI:    1 - Phimosis - pt with progressive bother from phimosis x several years. Has had few bouts of balanoposthitis treated topically. Has significant pain with intercourse and interfearing with hygeine. Very interestred in circ. No plantar fasciitis, abnormal scarring, Duptryen's.   PMH sig for ADD. His PCP is Farris HasAaron Morrow [facility].  Today " Craig Wiley " is seen to proceed with elective circumcision.     Past Medical History  Diagnosis Date  . Anxiety   . Mild persistent asthma   . Phimosis   . GERD (gastroesophageal reflux disease)     Past Surgical History  Procedure Laterality Date  . No past surgeries      Family History  Problem Relation Age of Onset  . Deep vein thrombosis Mother   . Asthma Maternal Grandfather    Social History:  reports that he quit smoking about 12 months ago. His smoking use included Cigarettes. He has a .15 pack-year smoking history. He has never used smokeless tobacco. He reports that he does not drink alcohol or use illicit drugs.  Allergies: No Known Allergies  No prescriptions prior to admission    No results found for this or any previous visit (from the past 48 hour(s)). No results found.  Review of Systems  Constitutional: Negative.   HENT: Negative.   Eyes: Negative.   Respiratory: Negative.   Cardiovascular: Negative.   Gastrointestinal: Negative.   Genitourinary: Negative.  Negative for hematuria and flank pain.  Musculoskeletal: Negative.   Skin: Negative.   Neurological: Negative.   Endo/Heme/Allergies: Negative.   Psychiatric/Behavioral: Negative.     There were no vitals taken for this visit. Physical Exam  Constitutional: He appears well-developed.  HENT:  Head: Normocephalic.  Eyes: Pupils are equal, round, and reactive to light.  Neck: Normal range of motion.  Cardiovascular: Normal rate.   Respiratory: Effort normal.   GI: Soft.  Genitourinary:  No CVAT  Musculoskeletal: Normal range of motion.  Neurological: He is alert.  Skin: Skin is warm.  Psychiatric: He has a normal mood and affect. His behavior is normal. Judgment and thought content normal.     Assessment/Plan  1 - Phimosis - We rediscussed the natural history of phimosis with progressive scarring / tightening of the foreskin with phimotic ring which can be painful and interfere with sexual function as well as hygiene. We also rediscussed the increased risk of penile cancer in patients with chronic phimosis and poor hygiene. I rementioned that the condition is exclusive to men without prior adequate circumcision and is especially prominent in diabetics.  We rediscussed management options of local therapy (steroid creams, etc.) and surgical therapy with dorsal slit versus circumcision. We rediscussed risks of circumcision including poor cosmesis ("taking too much"), decreased penile sensation and sexual pleasure, wound problems as well as bleeding and rare risks including DVT, PE, MI, CVA, Mortality.   After reconsideration of options, the patient has elected elective circumcision.   He wants to avoid general anasthetic if possible, and I feel he would do fine with MAC + penile block, will reqeust / plan for that but low threshold for conversion to general given young age and h/o anxiety. He is in agreement.    Sebastian AcheMANNY, Shyam Dawson, MD 02/02/2016, 6:27 AM

## 2016-02-02 NOTE — Discharge Instructions (Signed)
1 - Remove dressing Thursday morning at home. There are two layers, and outer brown-colored bandage type layer and an inner yellow-tinged gauze layers. Dissolvable stitches will disappear in 1-2 weeks. No sexual intercourse / stimulation until resolution of visible stitch material, usually in about 2 weeks. You may shower tomorrow.  2 - Call MD or go to ER for fever >102, severe pain / nausea / vomiting not relieved by medications, or acute change in medical status  Post Anesthesia Home Care Instructions  Activity: Get plenty of rest for the remainder of the day. A responsible adult should stay with you for 24 hours following the procedure.  For the next 24 hours, DO NOT: -Drive a car -Advertising copywriterperate machinery -Drink alcoholic beverages -Take any medication unless instructed by your physician -Make any legal decisions or sign important papers.  Meals: Start with liquid foods such as gelatin or soup. Progress to regular foods as tolerated. Avoid greasy, spicy, heavy foods. If nausea and/or vomiting occur, drink only clear liquids until the nausea and/or vomiting subsides. Call your physician if vomiting continues.  Special Instructions/Symptoms: Your throat may feel dry or sore from the anesthesia or the breathing tube placed in your throat during surgery. If this causes discomfort, gargle with warm salt water. The discomfort should disappear within 24 hours.  If you had a scopolamine patch placed behind your ear for the management of post- operative nausea and/or vomiting:  1. The medication in the patch is effective for 72 hours, after which it should be removed.  Wrap patch in a tissue and discard in the trash. Wash hands thoroughly with soap and water. 2. You may remove the patch earlier than 72 hours if you experience unpleasant side effects which may include dry mouth, dizziness or visual disturbances. 3. Avoid touching the patch. Wash your hands with soap and water after contact with the  patch.

## 2016-02-02 NOTE — Anesthesia Procedure Notes (Signed)
Procedure Name: MAC Date/Time: 02/02/2016 1:46 PM Performed by: Maris BergerENENNY, Yahel Fuston T Oxygen Delivery Method: Nasal cannula Placement Confirmation: positive ETCO2

## 2016-02-03 ENCOUNTER — Encounter (HOSPITAL_BASED_OUTPATIENT_CLINIC_OR_DEPARTMENT_OTHER): Payer: Self-pay | Admitting: Urology

## 2016-02-03 NOTE — Op Note (Signed)
NAME:  Craig Wiley, Craig Wiley                ACCOUNT NO.:  MEDICAL RECORD NO.:  [id number]  LOCATION:                                 FACILITY:  PHYSICIAN:  Sebastian Acheheodore Jaylei Fuerte, MD          DATE OF BIRTH:  DATE OF PROCEDURE: 02/02/2016                              OPERATIVE REPORT   INDICATION:  Phimosis.  PROCEDURE:  Circumcision with penile block.  ESTIMATED BLOOD LOSS:  Nil.  COMPLICATIONS:  None.  SPECIMEN:  Redundant foreskin for permanent pathology.  FINDINGS:  Significant phimosis.  Circumcision unable to reduce the foreskin.  Complete resolution of the phimosis post circumcision.  INDICATION:  Craig Wiley is a pleasant 25 year old young man with longstanding history of significant phimosis.  He has been able to reduce his foreskin for number of years.  He has not had a recent episode of balanitis.  This is becoming progressively bothersome and interfering with hygiene and sexual activity.  Options were discussed including surveillance versus trial of topical versus surgical intervention with dorsal slit versus circumcision.  He adamantly wished to proceed with circumcision under monitored anesthesia care.  Informed consent was obtained and placed in medical record.  PROCEDURE IN DETAIL:  The patient being Craig Wiley verified, procedure being circumcision under MAC was confirmed.  Procedure was carried out.  Time-out was performed.  Intravenous antibiotics were administered.  Monitored anesthesia care, conscious sedation administered.  Sterile field was created by prepping and draping the patient's penis, scrotum, and proximal thighs using iodinated prep. Attention was then directed towards the penile block.  An 50% mixture of 1% plain lidocaine and Marcaine was injected, 10 mL in a ring block-type fashion, 10 mL aimed at the dorsal penile nerve just below the pubic ramus, thus performing a penile block.  The phimosis was unable to be reduced given the severity as  such an initial 12 o'clock dorsal slit was performed.  This then allowed reduction of the foreskin. An additional iodinated prep was then applied.  A distal collar was created by incising circumferentially around the penis, 7 mm proximal to the corona of the glans.  The foreskin was once again laid into anatomic position and a proximal collar was marked and circumferentially incised taking great care to avoid excessive skin reduction.  Underlying attachments were taken down using cautery.  Additional hemostasis was achieved with point cautery, which resulted in excellent hemostasis. The foreskin was initially brought back into apposition at the 6 o'clock to 12 o'clock positions with interrupted U stitches and the skin was reapproximated on the right and left sides with separate running suture lines of 4-0 Vicryl resulted in excellent hemostasis and excellent cosmesis and reduction of all phimosis without excessive reduction of foreskin.  Dressing of Xeroform followed by a loosely compressive Coban was then applied and the procedure terminated.  The patient tolerated the procedure well.  There were no immediate periprocedural complications.  The patient was taken to the postanesthesia care unit in stable condition.          ______________________________ Sebastian Acheheodore Zyir Gassert, MD     TM/MEDQ  D:  02/02/2016  T:  02/02/2016  Job:  [id number]

## 2016-02-03 NOTE — Anesthesia Postprocedure Evaluation (Signed)
Anesthesia Post Note  Patient: Craig Wiley  Procedure(s) Performed: Procedure(s) (LRB): CIRCUMCISION ADULT (N/A)  Patient location during evaluation: PACU Anesthesia Type: MAC Level of consciousness: awake and alert Pain management: pain level controlled Vital Signs Assessment: post-procedure vital signs reviewed and stable Respiratory status: spontaneous breathing, nonlabored ventilation, respiratory function stable and patient connected to nasal cannula oxygen Cardiovascular status: stable and blood pressure returned to baseline Anesthetic complications: no    Last Vitals:  Filed Vitals:   02/02/16 1500 02/02/16 1533  BP: 109/67 99/50  Pulse: 64 56  Temp:  36.7 C  Resp: 13 14    Last Pain: There were no vitals filed for this visit.               Arsh Feutz J

## 2016-05-09 ENCOUNTER — Ambulatory Visit (INDEPENDENT_AMBULATORY_CARE_PROVIDER_SITE_OTHER): Payer: BC Managed Care – PPO | Admitting: Psychology

## 2016-05-09 ENCOUNTER — Encounter (HOSPITAL_COMMUNITY): Payer: Self-pay | Admitting: Psychology

## 2016-05-09 ENCOUNTER — Encounter (INDEPENDENT_AMBULATORY_CARE_PROVIDER_SITE_OTHER): Payer: Self-pay

## 2016-05-09 DIAGNOSIS — F1320 Sedative, hypnotic or anxiolytic dependence, uncomplicated: Secondary | ICD-10-CM

## 2016-05-09 DIAGNOSIS — F1220 Cannabis dependence, uncomplicated: Secondary | ICD-10-CM

## 2016-05-09 DIAGNOSIS — F1020 Alcohol dependence, uncomplicated: Secondary | ICD-10-CM | POA: Diagnosis not present

## 2016-05-09 DIAGNOSIS — F1994 Other psychoactive substance use, unspecified with psychoactive substance-induced mood disorder: Secondary | ICD-10-CM | POA: Diagnosis not present

## 2016-05-09 NOTE — Progress Notes (Signed)
Craig Wiley is a 25 y.o. male patient.   CD-IOP INDIVIDUAL ORIENTATION SESSION: Patient is a 25 yo single white male seeking entry into CD-IOP. Patient presents with alcohol use disorder, cannabis use disorder, anxiolytic use disorder, and significant anxiety. Patient reports having agoraphobia and fear of panicking. Patient was recently discharged with "moderate" success from [facility] near [place], [place]. Patient reports that he was clean and sober from all mind-altering drugs while in the 60 day treatment from 5/19-7/19. Discharge summary shows that the patient left earlier than the recommended 90 days due to a desire to get back to his house and family after his dog died accidentally. Patient last used marijuana and klonopin on 7/24 due to "anxiety about starting treatment and grieving the lost of his dog". Patient has a long and extensive hx of drug and alcohol beginning at age 69 as a coping  Mechanism for being bullied in high school. Patient was labeled ADHD in school and began taking Concerta in college at age 62. He said that he was "not that into it" and only took it for 1 year. He reports that he prefers alcohol, marijuana, and xanax to quell his anxiety. He also reports that these substances cause him further anxiety the following day. He is currently px Lexapro and Gabbapentin but he feels that "they're not working very well". He described himself as "an anxious kid". Patient presents as intelligent, personable, and open about being an alcoholic and addict. He lives with his mother and step father here in [place] and hopes to attend more school later into his recovery. He recently moved to the [place] from [place] where he was living with his biological father up until October 2016. He tried to enter our CD-IOP program in Dec 2016 but reported today that he was "only doing it for his mom", and that "now he wants recovery for himself". He feels that he is "lacking coping skills" and he does  not want to "rely on drugs and alcohol for his anxiety anymore". Patient is active in mixed martial arts, body-building, and eats a healthy diet. He reports that his sleep is mixed but mostly good. Patient seems to have good insight into his addiction and a strong motivation to change. Patient reports that he has strong support through his parents who are "very supportive". Patient presented as active, engaged, focused, and somewhat rigid during session today. His sobriety date is 7/25.      Charmian Muff, LCAS

## 2016-05-10 ENCOUNTER — Other Ambulatory Visit: Payer: Self-pay

## 2016-05-10 ENCOUNTER — Encounter (HOSPITAL_COMMUNITY): Payer: Self-pay

## 2016-05-10 ENCOUNTER — Other Ambulatory Visit (HOSPITAL_COMMUNITY): Payer: BC Managed Care – PPO | Attending: Psychiatry | Admitting: Psychology

## 2016-05-10 VITALS — BP 118/70 | HR 63 | Ht 69.0 in | Wt 231.8 lb

## 2016-05-10 DIAGNOSIS — F900 Attention-deficit hyperactivity disorder, predominantly inattentive type: Secondary | ICD-10-CM

## 2016-05-10 DIAGNOSIS — F1020 Alcohol dependence, uncomplicated: Secondary | ICD-10-CM | POA: Insufficient documentation

## 2016-05-10 DIAGNOSIS — F19980 Other psychoactive substance use, unspecified with psychoactive substance-induced anxiety disorder: Secondary | ICD-10-CM

## 2016-05-10 DIAGNOSIS — F1994 Other psychoactive substance use, unspecified with psychoactive substance-induced mood disorder: Secondary | ICD-10-CM

## 2016-05-10 DIAGNOSIS — F12280 Cannabis dependence with cannabis-induced anxiety disorder: Secondary | ICD-10-CM | POA: Insufficient documentation

## 2016-05-10 DIAGNOSIS — J4530 Mild persistent asthma, uncomplicated: Secondary | ICD-10-CM

## 2016-05-10 DIAGNOSIS — F1220 Cannabis dependence, uncomplicated: Secondary | ICD-10-CM | POA: Insufficient documentation

## 2016-05-10 DIAGNOSIS — F4310 Post-traumatic stress disorder, unspecified: Secondary | ICD-10-CM | POA: Insufficient documentation

## 2016-05-10 DIAGNOSIS — F1320 Sedative, hypnotic or anxiolytic dependence, uncomplicated: Secondary | ICD-10-CM | POA: Insufficient documentation

## 2016-05-10 MED ORDER — PROPRANOLOL HCL 10 MG PO TABS: 10.0000 mg | ORAL_TABLET | Freq: Two times a day (BID) | ORAL | 1 refills | Status: DC

## 2016-05-10 NOTE — Progress Notes (Signed)
Psychiatric Initial Adult Assessment   Patient Identification: Craig Wiley MRN:  [id number] Date of Evaluation:  05/10/2016 Referral Source: Self/[facility] Chief Complaint:   Visit Diagnosis:    ICD-9-CM ICD-10-CM   1. Cannabis use disorder, severe, dependence (HCC) 304.30 F12.20 COMPLETE METABOLIC PANEL WITH GFR  2. Alcohol use disorder, severe, dependence (HCC) 303.90 F10.20 COMPLETE METABOLIC PANEL WITH GFR  3. Sedative, hypnotic or anxiolytic use disorder, severe, dependence (HCC) 304.10 F13.20 COMPLETE METABOLIC PANEL WITH GFR  4. Substance-induced anxiety disorder (HCC) 292.89 F19.980    E980.5    5. Substance induced mood disorder (HCC) 292.84 F19.94   6. ADHD (attention deficit hyperactivity disorder), inattentive type 314.01 F90.0   7. Mild persistent asthma in adult without complication 493.90 J45.30     History of Present Illness:  25 y/o WM known to this program from previous attempt at treatment:   BEHAVIORAL HEALTH INTENSIVE CHEMICAL DEPENDENCY  Charmian Muff, LCAS  Psychology   Alcohol Problem, Drug Problem, Anxiety  Reason for Visit   09/24/2015 Craig Wiley is a 25 y.o. male patient. Orientation to CD-IOP: the patient is a 25 yo single, white, male seeking entry into the CD-IOP. The patient is currently living with his mother, step-father and 20 yo step-sister here in [place]. He recently moved here from [place], [place] where he has lived with his father for a number of years. The patient has a long history drug use which began at age 63 with alcohol and cannabis. He was prescribed Concerta for ADD at that same age, but stated that he took it rarely. The patient explained he has struggled with anxiety for much of his life and the prescribed stimulant only made the anxiety worse.  The patient used cannabis daily (Cannabis is readily available for medicinal use in [place]) and binge drank at least 3-4 times per week. His problems escalated after he was prescribed  Ativan for the anxiety at age 38. At one point, the patient was prescribed 8 mg of Ativan, QID, but somehow the script was mistakenly increased up at 16 mgs. The patient has been hospitalized twice for alcohol detox over the years and most recently, entered a facility in [place] to complete a benzodiazepine detox. [facility] on October 17th and was discharged 8 days later. The patient admitted he has taken about 4 Valium since late October, but has remained free of alcohol and cannabis since before entering [facility]. When asked about efforts to stop drinking and drugging, the patient reported he had stopped drinking for 2 months about a year ago. When questioned further, he admitted that his use of cannabis had increased markedly during that time. The patient has experienced many negative consequences as a result of his alcohol and drug use, including flunking out of school numerous times, losing jobs because of his drug use as well as ongoing problems with his family. He also has a pending DWI from over 2 years ago. The patient reported he had blacked out for almost 24 hours after taking 4 mg of Ativan and woke up in jail with no memory of what he had done. Addiction is prevalent on both sides of his family. The patient's father is an alcoholic and addict as are numerous paternal uncles and his paternal grandmother. His maternal grandfather and great grandfather were also alcoholics. The patient's parent's divorced when he was 64 years old. He lived with his mother for most of his youth, but has been  living with his father in [place] for a number of years. The patient attributes his growing use of chemicals to the anxiety that has followed him for much of his life. He is currently prescribed Celexa and Neurontin, but reported he still feels very anxious. He displayed good motivation and a willingness to do what the program asks of him. His mother and step-father are  supportive of his efforts to remain drug-free. The documentation was reviewed, signed and completed accordingly. The patient will return tomorrow and begin the CD-IOP.  t. CD-IOP: The patient was scheduled to begin the program today, but appears to be resisting treatment. He and his mother both contacted me this morning and I spoke with him and left message for her. 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 We will welcome him when he appears.    .Patient subsequently returned 05/09/16 seeking treatment again: Progress Notes Sensitive Note  Encounter Date: 05/09/2016 Charmian Muff, LCAS  Psychology    Hide copied text Hover for attribution information Craig Wiley is a 25 y.o. male patient.    CD-IOP INDIVIDUAL ORIENTATION SESSION: Patient is a 26 yo single white male seeking entry into CD-IOP. Patient presents with alcohol use disorder, cannabis use disorder, anxiolytic use disorder, and significant anxiety. Patient reports having agoraphobia and fear of panicking. Patient was recently discharged with "moderate" success from [facility] near [place], [place]. Patient reports that he was clean and sober from all mind-altering drugs while in the 60 day treatment from 5/19-7/19. Discharge summary shows that the patient left earlier than the recommended 90 days due to a desire to get back to his house and family after his dog died accidentally. Patient last used marijuana and klonopin on 7/24 due to "anxiety about starting treatment and grieving the lost of his dog". Patient has a long and extensive hx of drug and alcohol beginning at age 7 as a coping  Mechanism for being bullied in high school. Patient was labeled ADHD in school and began taking Concerta in college at age 33. He said that he was "not that into it" and only took it for 1 year. He  reports that he prefers alcohol, marijuana, and xanax to quell his anxiety. He also reports that these substances cause him further anxiety the following day. He is currently px Lexapro and Gabbapentin but he feels that "they're not working very well". He described himself as "an anxious kid". Patient presents as intelligent, personable, and open about being an alcoholic and addict. He lives with his mother and step father here in [place] and hopes to attend more school later into his recovery. He recently moved to the [place] from [place] where he was living with his biological father up until October 2016. He tried to enter our CD-IOP program in Dec 2016 but reported today that he was "only doing it for his mom", and that "now he wants recovery for himself". He feels that he is "lacking coping skills" and he does not want to "rely on drugs and alcohol for his anxiety anymore". Patient is active in mixed martial arts, body-building, and eats a healthy diet. He reports that his sleep is mixed but mostly good. Patient seems to have good insight into his addiction and a strong motivation to change. Patient reports that he has strong support through his parents who are "very  supportive". Patient presented as active, engaged, focused, and somewhat rigid during session today. His sobriety date is 7/25.          In speaking with him today, he is back in [place] with his mother to complete his remaining 2 yrs of College at [employer] where she is a professor and to complete aftercare CDIOP here rather than at [facility]. He continues to experience substance induced anxious depression.He has not found Neurontin and Lexapro effective for his SIMD. He is willing to try nonaddictive medications.His history is complicated by allergic asthma for which he takes Symbicort daily and has rescue inhaler Proair.He has not had an attack since december 2016.  Associated Signs/Symptoms: Depression Symptoms:  depressed  mood, anhedonia, feelings of worthlessness/guilt, difficulty concentrating, PHQ 9 score 13 (Hypo) Manic Symptoms:  NA except as related to Addictions Anxiety Symptoms:  Agoraphobia,+ Excessive Worry, + Panic Symptoms,+Avoidance helps Obsessive Compulsive Symptoms:   None,, Social Anxiety,Somewhat Specific Phobias,Heights;spiders Psychotic Symptoms:  None PTSD Symptoms: Denies buthas historyn of bullying in High School-currently engaged in MMA     Past Psychiatric History: Diagnosis: Polysubstance dependence Cannabis;Benzos;Alcohol;Dependent personality traits;Substanc einduce anxiety/Agoraphobia with panic ;ADHD (age 82?);  Hospitalizations: [facility] [place] for detox [facility] 2013 and [facility] [place]-stayed 1 day and left;[facility] [place] stayed 60/90 days May-July 2017  Outpatient Care: 10 different Counselors age 33-23;[facility] OP CD IOP 09/2015 and 05/09/2016  Substance Abuse Care: Above  Self-Mutilation: None  Suicidal Attempts: NONE  Violent Behaviors: NONE   Previous Psychotropic Medications: Yes Concerta;Ativan: Klonopin  Substance Abuse History in the last 12 months:  Yes.    Substance Abuse History in the last 12 months: Substance Age of 1st Use Last Use Amount Specific Type  Nicotine 14 Quit 01/2015 3 Cigs daily  Alcohol 14 03/02/16 11 Beers  Cannabis 14 05/08/16 Dube/Bong 8-12x daily  Opiates 24  Post op Vicodin  Cocaine 22 03/2014 5x Clubbing powder  Methamphetamines      LSD      Ecstasy      Benzodiazepines 22 05/07/16 8-25 mg daily Xanax Ativan Klonopin  Caffeine      Inhalants      Others:Phenobarbital   Detox                      Consequences of Substance Abuse: Medical Consequences:  Detox Legal Consequences: DWI Family Consequences: Conflict/concern Blackouts: with alcohol and benzos  DT's: No Withdrawal Symptoms:    Cramps Diaphoresis Nausea Tremors  Past Medical History:  Past Medical History:   Diagnosis Date  . Anxiety Substance induced   . GERD (gastroesophageal reflux disease)   . Mild persistent asthma   . Phimosis resolved       Family Psychiatric History: All 4 grandparents and father alcoholic  Family History:  Family History  Problem Relation Age of Onset  . Deep vein thrombosis Mother   . Asthma Maternal Grandfather   . Alcohol abuse Maternal Grandfather   . Alcohol abuse Father   . Alcohol abuse Maternal Grandmother   . Alcohol abuse Paternal Grandfather   . Alcohol abuse Paternal Grandmother     Social History:   Social History  . Marital status: Single    Spouse name: N/A  . Number of children: N/A  . Years of education: 2 yrs of College   Occupational History  . Unemployed    Social History Main Topics  . Smoking status: Former Smoker  Packs/day: 0.03    Years: 5.00    Types: Cigarettes    Quit date: 01/15/2015  . Smokeless tobacco: Never Used  . Alcohol use 21.6 oz/week    36 Cans of beer per week     Comment: Weekend binge drinker, drank 3 times/week  . Drug use:     Frequency: 7.0 times per week    Types: Amphetamines, Benzodiazepines, Marijuana, Methylphenidate     Comment: 8 years of daily marijuana use, 2-3 years of daily xanax use  . Sexual activity: Heterosexual/active uses protection   Other Topics Concern  . Not on file   Social History Narrative    Social History: Current Place of Residence: Lives with Mom in [place]- Place of Birth: [place] Family Members: M-living Professor at [employer] Marital Status:  Single Children: None  Sons:  Daughters:  Relationships: Single Education:  Corporate treasurer Problems/Performance: B's (3.0) Religious Beliefs/Practices: None History of Abuse: none Occupational Experiences;Lifeguard;Swimming teacher;First Aid Instructor Military History:  None. Legal History: DUI [place] Hobbies/Interests: Exercise MMA;Wgt Lifting;Music          Additional Social History: Travels between [place] and [place] where father works for [employer] Soon to have dual citizenship.Plans to complete Degree at [employer]   Allergies:  No Known Allergies  Metabolic Disorder Labs: No results found for: HGBA1C, MPG No results found for: PROLACTIN No results found for: CHOL, TRIG, HDL, CHOLHDL, VLDL, LDLCALC   Current Medications: Current Outpatient Prescriptions  Medication Sig Dispense Refill  . albuterol (PROAIR HFA) 108 (90 Base) MCG/ACT inhaler Inhale 2 puffs into the lungs every 6 (six) hours as needed for wheezing or shortness of breath.    . budesonide-formoterol (SYMBICORT) 160-4.5 MCG/ACT inhaler Inhale 2 puffs into the lungs 2 (two) times daily. 1 Inhaler 11  . escitalopram (LEXAPRO) 20 MG tablet Take 20 mg by mouth every morning.     . gabapentin (NEURONTIN) 800 MG tablet Take 800 mg by mouth 3 (three) times daily.     . propranolol (INDERAL) 10 MG tablet Take 1 tablet (10 mg total) by mouth 2 (two) times daily. 60 tablet 1   No current facility-administered medications for this visit.     Neurologic: Headache: Negative Seizure: Negative Paresthesias:Negative  Musculoskeletal: Strength & Muscle Tone: within normal limits Gait & Station: normal Patient leans: N/A  Psychiatric Specialty Exam: Review of Systems  Constitutional: Negative for chills, diaphoresis, fever, malaise/fatigue and weight loss.  HENT: Negative for congestion, ear discharge, ear pain, hearing loss, nosebleeds, sore throat and tinnitus.   Eyes: Negative for blurred vision, double vision, photophobia, pain, discharge and redness.  Respiratory: Positive for stridor (in past with asthma). Negative for cough, hemoptysis, sputum production, shortness of breath and wheezing (currently/hx of ad sthma).   Cardiovascular: Positive for palpitations (anxiety related). Negative for chest pain, orthopnea, claudication, leg swelling and PND.  Gastrointestinal:  Negative for abdominal pain, blood in stool, constipation, diarrhea, heartburn, melena, nausea and vomiting.  Genitourinary: Negative for dysuria, flank pain, frequency, hematuria and urgency.  Musculoskeletal: Negative for back pain, falls, joint pain, myalgias and neck pain.  Skin: Negative for itching and rash.  Neurological: Negative for dizziness, tingling, tremors, sensory change, speech change, focal weakness, seizures, loss of consciousness, weakness and headaches.  Endo/Heme/Allergies: Positive for environmental allergies. Negative for polydipsia. Does not bruise/bleed easily.  Psychiatric/Behavioral: Positive for depression (PHQ-9), memory loss (in distant past with ETOH blackouts) and substance abuse. Negative for hallucinations and suicidal ideas. The patient is  nervous/anxious. The patient does not have insomnia.     Blood pressure 118/70, pulse 63, height 5\' 9"  (1.753 m), weight 231 lb 12.8 oz (105.1 kg).Body mass index is 34.23 kg/m.  General Appearance: Well Groomed  Eye Contact:  Good  Speech:  Clear and Coherent  Volume:  Normal  Mood:  Variable  Affect:  Congruent and Full Range  Thought Process:  Coherent  Orientation:  Full (Time, Place, and Person)  Thought Content:  WDL  Suicidal Thoughts:  No  Homicidal Thoughts:  No  Memory:  Negative  Judgement:  Impaired  Insight:  Lacking  Psychomotor Activity:  Normal  Concentration:  Concentration: Good and Attention Span: Good  Recall:  Good  Fund of Knowledge:Good  Language: Good  Akathisia:  NA  Handed:  Right  AIMS (if indicated):  NA  Assets:  Communication Skills Desire for Improvement Financial Resources/Insurance Housing Physical Health Social Support Transportation  ADL's:  Intact  Cognition: Impaired,  Moderate  Sleep:  Denies problem    Treatment Plan Summary: Treatment Plan/Recommendations:  Plan of Care: [facility] CD IOP  Laboratory:  UDS CMP  Psychotherapy: IOP Group and individual   Medications:See list RX Propranolol for performance anxiety with asthma precautions.May taper Gabapentin  Routine PRN Medications:  Propranolol 10 mg bid  Consultations: None at this time  Safety Concerns: Asthma/relapse  Other:       Maryjean Morn, PA-C 7/26/20175:21 PM

## 2016-05-11 ENCOUNTER — Other Ambulatory Visit (HOSPITAL_COMMUNITY): Payer: BC Managed Care – PPO

## 2016-05-15 ENCOUNTER — Other Ambulatory Visit (HOSPITAL_COMMUNITY): Payer: BC Managed Care – PPO | Admitting: Psychology

## 2016-05-15 DIAGNOSIS — F1220 Cannabis dependence, uncomplicated: Secondary | ICD-10-CM

## 2016-05-15 DIAGNOSIS — F1020 Alcohol dependence, uncomplicated: Secondary | ICD-10-CM

## 2016-05-15 DIAGNOSIS — F19980 Other psychoactive substance use, unspecified with psychoactive substance-induced anxiety disorder: Secondary | ICD-10-CM

## 2016-05-15 DIAGNOSIS — F1320 Sedative, hypnotic or anxiolytic dependence, uncomplicated: Secondary | ICD-10-CM | POA: Diagnosis not present

## 2016-05-15 DIAGNOSIS — F4310 Post-traumatic stress disorder, unspecified: Secondary | ICD-10-CM | POA: Diagnosis not present

## 2016-05-15 DIAGNOSIS — F12280 Cannabis dependence with cannabis-induced anxiety disorder: Secondary | ICD-10-CM | POA: Diagnosis not present

## 2016-05-16 ENCOUNTER — Encounter (HOSPITAL_COMMUNITY): Payer: Self-pay | Admitting: Psychology

## 2016-05-16 DIAGNOSIS — F1020 Alcohol dependence, uncomplicated: Secondary | ICD-10-CM

## 2016-05-16 DIAGNOSIS — F19980 Other psychoactive substance use, unspecified with psychoactive substance-induced anxiety disorder: Secondary | ICD-10-CM

## 2016-05-16 DIAGNOSIS — F1320 Sedative, hypnotic or anxiolytic dependence, uncomplicated: Secondary | ICD-10-CM

## 2016-05-16 DIAGNOSIS — F1220 Cannabis dependence, uncomplicated: Secondary | ICD-10-CM

## 2016-05-16 NOTE — Progress Notes (Signed)
    Daily Group Progress Note  Program: CD-IOP   05/16/2016 Drusilla Kanner [id number]  Diagnosis:  Cannabis use disorder, severe, dependence ([place])  Alcohol use disorder, severe, dependence ([place])  Sedative, hypnotic or anxiolytic use disorder, severe, dependence ([place])  Substance-induced anxiety disorder ([place])   Sobriety Date: 05/10/16  Patient met with Medical Director: (Y/N) N  Group Time: 1-2:30  Participation Level: Active  Behavioral Response: Appropriate and Sharing  Type of Therapy: Process Group  Interventions: Motivational Interviewing and Solution Focused  Topic: Patients and counselor discussed issues related to patient's sobriety and challenges they are facing. Counselor worked to grow motivation to change behavior and hold members accountable to the goals they had set. Patients participated in an outdoor activity aimed at [place] in recovery-thinking. One new patient met with Investment banker, operational.     Group Time: 2:45-4  Participation Level: Active  Behavioral Response: Appropriate and Sharing  Type of Therapy: Psycho-education Group  Interventions: Psychosocial Skills: Recognizing Triggers  Topic: Patients and counselor discussed "Red and Green Flags" from Seeking Safety curriculum for PTSD and Substance Abuse. Patients shared about their most-likely "high risk situations" and how they could prevent a return to use.    Summary: Patient shared that he did not go to any meetings over the weekend. He was able to work out but experienced a high level of anxiety during it. He shared with the group in detail how his anxiety presents and inhibits his sociability and activeness in public places. He shared that he would like to "be more comfortable in my skin". He shared that he only really "hangs out with my parents' and he does not have any friends here in [place]. He expressed a desire to begin training for Mixed Martial Arts though his anxiety is  "too high right now". He reported that he wants to get a sponsor in [place] so he can begin step work. He identified boredom and isolation as his biggest triggers that would cause him to use or have anxiety. Youlanda Roys, Counselor   UDS collected: No Results: negative  AA/NA attended?: No  Sponsor?: No   Brandon Melnick, LCAS 05/16/2016 5:04 PM

## 2016-05-17 ENCOUNTER — Other Ambulatory Visit (HOSPITAL_COMMUNITY): Payer: Self-pay | Admitting: Medical

## 2016-05-17 ENCOUNTER — Other Ambulatory Visit (HOSPITAL_COMMUNITY): Payer: BC Managed Care – PPO | Attending: Psychiatry | Admitting: Psychology

## 2016-05-17 ENCOUNTER — Encounter (HOSPITAL_COMMUNITY): Payer: Self-pay | Admitting: Medical

## 2016-05-17 DIAGNOSIS — F1320 Sedative, hypnotic or anxiolytic dependence, uncomplicated: Secondary | ICD-10-CM | POA: Diagnosis not present

## 2016-05-17 DIAGNOSIS — J4530 Mild persistent asthma, uncomplicated: Secondary | ICD-10-CM | POA: Insufficient documentation

## 2016-05-17 DIAGNOSIS — F1020 Alcohol dependence, uncomplicated: Secondary | ICD-10-CM | POA: Diagnosis not present

## 2016-05-17 DIAGNOSIS — F1994 Other psychoactive substance use, unspecified with psychoactive substance-induced mood disorder: Secondary | ICD-10-CM

## 2016-05-17 DIAGNOSIS — F19980 Other psychoactive substance use, unspecified with psychoactive substance-induced anxiety disorder: Secondary | ICD-10-CM

## 2016-05-17 DIAGNOSIS — F900 Attention-deficit hyperactivity disorder, predominantly inattentive type: Secondary | ICD-10-CM | POA: Diagnosis not present

## 2016-05-17 DIAGNOSIS — F1220 Cannabis dependence, uncomplicated: Secondary | ICD-10-CM | POA: Diagnosis not present

## 2016-05-17 LAB — PRESCRIPTION ABUSE MONITORING 17P, URINE
6-ACETYLMORPHINE, URINE: NEGATIVE ng/mL
AMPHETAMINE SCREEN URINE: NEGATIVE ng/mL
BARBITURATE SCREEN URINE: NEGATIVE ng/mL
Buprenorphine, Urine: NEGATIVE ng/mL
CREATININE(CRT), U: 160.6 mg/dL (ref 20.0–300.0)
Carisoprodol/Meprobamate, Ur: NEGATIVE ng/mL
Cocaine (Metab) Scrn, Ur: NEGATIVE ng/mL
EDDP, URINE: NEGATIVE ng/mL
Fentanyl, Urine: NEGATIVE pg/mL
MDMA SCREEN, URINE: NEGATIVE ng/mL
METHADONE SCREEN, URINE: NEGATIVE ng/mL
Meperidine Screen, Urine: NEGATIVE ng/mL
NITRITE URINE, QUANTITATIVE: NEGATIVE ug/mL
OPIATE SCREEN URINE: NEGATIVE ng/mL
OXYCODONE+OXYMORPHONE UR QL SCN: NEGATIVE ng/mL
PH UR, DRUG SCRN: 6.3 (ref 4.5–8.9)
PHENCYCLIDINE QUANTITATIVE URINE: NEGATIVE ng/mL
PROPOXYPHENE SCREEN URINE: NEGATIVE ng/mL
SPECIFIC GRAVITY: 1.015
TAPENTADOL, URINE: NEGATIVE ng/mL
Tramadol Screen, Urine: NEGATIVE ng/mL

## 2016-05-17 LAB — BENZODIAZEPINES CONFIRM, URINE
ALPRAZOLAM: NEGATIVE
BENZODIAZEPINES: POSITIVE ng/mL — AB
CLONAZEPAM: NEGATIVE
Flurazepam: NEGATIVE
LORAZEPAM: NEGATIVE
MIDAZOLAM: NEGATIVE
NORDIAZEPAM CONFIRM: 168 ng/mL
NORDIAZEPAM: POSITIVE — AB
OXAZEPAM GC/MS CONF: 404 ng/mL
Oxazepam: POSITIVE — AB
TEMAZEPAM CONFIRM: 265 ng/mL
TEMAZEPAM: POSITIVE — AB
TRIAZOLAM: NEGATIVE

## 2016-05-17 LAB — ETHYL GLUCURONIDE, URINE: Ethyl Glucuronide Screen, Ur: NEGATIVE ng/mL

## 2016-05-17 LAB — CANNABINOID (GC/MS), URINE
CANNABINOID UR: POSITIVE — AB
CARBOXY THC UR: 315 ng/mL

## 2016-05-17 MED ORDER — DIVALPROEX SODIUM ER 500 MG PO TB24: 500.0000 mg | ORAL_TABLET | Freq: Every day | ORAL | 2 refills | Status: DC

## 2016-05-17 MED ORDER — GABAPENTIN 800 MG PO TABS: 800.0000 mg | ORAL_TABLET | Freq: Three times a day (TID) | ORAL | 0 refills | Status: DC

## 2016-05-17 MED ORDER — BUPROPION HCL ER (SR) 100 MG PO TB12: 100.0000 mg | ORAL_TABLET | Freq: Two times a day (BID) | ORAL | 1 refills | Status: DC
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
## 2016-05-18 ENCOUNTER — Other Ambulatory Visit (HOSPITAL_COMMUNITY): Payer: BC Managed Care – PPO | Admitting: Psychology

## 2016-05-18 ENCOUNTER — Encounter (HOSPITAL_COMMUNITY): Payer: Self-pay | Admitting: Psychology

## 2016-05-18 NOTE — Progress Notes (Signed)
    Daily Group Progress Note  Program: CD-IOP   05/18/2016 Drusilla Kanner [id number]  Diagnosis:  Cannabis use disorder, severe, dependence ([place])  Alcohol use disorder, severe, dependence (HCC)  Sedative, hypnotic or anxiolytic use disorder, severe, dependence ([place])  Substance-induced anxiety disorder ([place])  Substance induced mood disorder ([place])  Mild persistent asthma in adult without complication  ADHD (attention deficit hyperactivity disorder), inattentive type   Sobriety Date: 05/17/16  Patient met with Medical Director: Aggie Moats) Y  Group Time: 1-2:30  Participation Level: Minimal  Behavioral Response: Appropriate and Sharing  Type of Therapy: Process Group  Interventions: Other: Recognizing Triggers  Topic:  The group used the first half of group to process events that occurred outside of group related to recovery.  The counselor and members were able to provide feedback to individuals struggling with certain aspects of sobriety.     Group Time: 2:45-4  Participation Level: Active  Behavioral Response: Appropriate and Sharing  Type of Therapy: Process Group  Interventions: Other: Recognizing Triggers  Topic: The second half of group focused on psycho-education.  The group employed a handout on key points about red and green flags in sobriety. The handout addresses flags of spiraling, talking about feelings, getting help from others, budding, and the most vulnerable time of relapse. The handout helps to bring awareness of potential weaknesses in recovery.    Summary: Client relapsed on "a little bit" of marijuana on 05/16/2016. Client's new sobriety date is 05/17/2016. Patient shared that he felt awful after using, and that he did not even enjoy it. Patient shared that he was driven to use, as a result of his birthday on [birth year]. Patient shared that he was discouraged, because he thought that he would have a lot more accomplished at this point in his life. He  shared that his parents locked up the marijuana so that he could not have access to it. Patient shared that he was going to a meeting Wednesday night, and that he was going to find a temporary sponsor. Counselor encouraged client to attend NA meetings, and to find a temporary sponsor to help hold him accountable. Youlanda Roys, Counselor   UDS collected: Yes Results: positive for benzodiazepines and marijuana  AA/NA attended?: No  Sponsor?: No   Brandon Melnick, [place] 05/18/2016 4:41 PM
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
## 2016-05-19 ENCOUNTER — Telehealth (HOSPITAL_COMMUNITY): Payer: Self-pay | Admitting: Psychology

## 2016-05-22 ENCOUNTER — Telehealth (HOSPITAL_COMMUNITY): Payer: Self-pay | Admitting: Psychology

## 2016-05-22 ENCOUNTER — Other Ambulatory Visit (HOSPITAL_COMMUNITY): Payer: BC Managed Care – PPO

## 2016-05-22 LAB — PRESCRIPTION ABUSE MONITORING 17P, URINE
6-Acetylmorphine, Urine: NEGATIVE ng/mL
AMPHETAMINE SCREEN URINE: NEGATIVE ng/mL
BARBITURATE SCREEN URINE: NEGATIVE ng/mL
BUPRENORPHINE, URINE: NEGATIVE ng/mL
CARISOPRODOL/MEPROBAMATE, UR: NEGATIVE ng/mL
COCAINE(METAB.)SCREEN, URINE: NEGATIVE ng/mL
CREATININE(CRT), U: 211.7 mg/dL (ref 20.0–300.0)
EDDP, URINE: NEGATIVE ng/mL
Fentanyl, Urine: NEGATIVE pg/mL
MDMA Screen, Urine: NEGATIVE ng/mL
MEPERIDINE SCREEN, URINE: NEGATIVE ng/mL
METHADONE SCREEN, URINE: NEGATIVE ng/mL
Nitrite Urine, Quantitative: NEGATIVE ug/mL
OXYCODONE+OXYMORPHONE UR QL SCN: NEGATIVE ng/mL
Opiate Scrn, Ur: NEGATIVE ng/mL
Ph of Urine: 6.5 (ref 4.5–8.9)
Phencyclidine Qn, Ur: NEGATIVE ng/mL
Propoxyphene Scrn, Ur: NEGATIVE ng/mL
SPECIFIC GRAVITY: 1.025
TAPENTADOL, URINE: NEGATIVE ng/mL
Tramadol Screen, Urine: NEGATIVE ng/mL

## 2016-05-22 LAB — BENZODIAZEPINES CONFIRM, URINE
ALPRAZOLAM: NEGATIVE
BENZODIAZEPINES: POSITIVE ng/mL — AB
CLONAZEPAM: NEGATIVE
Flurazepam: NEGATIVE
LORAZEPAM: NEGATIVE
MIDAZOLAM: NEGATIVE
NORDIAZEPAM CONFIRM: 232 ng/mL
NORDIAZEPAM: POSITIVE — AB
OXAZEPAM GC/MS CONF: 289 ng/mL
OXAZEPAM UR: POSITIVE — AB
TEMAZEPAM CONFIRM: 107 ng/mL
TEMAZEPAM: POSITIVE — AB
TRIAZOLAM: NEGATIVE

## 2016-05-22 LAB — CANNABINOID (GC/MS), URINE
CANNABINOID UR: POSITIVE — AB
CARBOXY THC UR: 222 ng/mL

## 2016-05-22 LAB — ETHYL GLUCURONIDE, URINE: ETHYL GLUCURONIDE SCREEN, UR: NEGATIVE ng/mL

## 2016-05-24 ENCOUNTER — Other Ambulatory Visit (HOSPITAL_COMMUNITY): Payer: BC Managed Care – PPO | Admitting: Psychology

## 2016-05-24 ENCOUNTER — Other Ambulatory Visit (HOSPITAL_COMMUNITY): Payer: Self-pay | Admitting: Medical

## 2016-05-24 DIAGNOSIS — F1220 Cannabis dependence, uncomplicated: Secondary | ICD-10-CM

## 2016-05-24 DIAGNOSIS — F19980 Other psychoactive substance use, unspecified with psychoactive substance-induced anxiety disorder: Secondary | ICD-10-CM

## 2016-05-24 DIAGNOSIS — F1994 Other psychoactive substance use, unspecified with psychoactive substance-induced mood disorder: Secondary | ICD-10-CM

## 2016-05-24 DIAGNOSIS — F1320 Sedative, hypnotic or anxiolytic dependence, uncomplicated: Secondary | ICD-10-CM

## 2016-05-24 DIAGNOSIS — F1020 Alcohol dependence, uncomplicated: Secondary | ICD-10-CM

## 2016-05-25 ENCOUNTER — Other Ambulatory Visit (HOSPITAL_COMMUNITY): Payer: BC Managed Care – PPO | Admitting: Psychology

## 2016-05-25 DIAGNOSIS — F1220 Cannabis dependence, uncomplicated: Secondary | ICD-10-CM

## 2016-05-25 DIAGNOSIS — F19980 Other psychoactive substance use, unspecified with psychoactive substance-induced anxiety disorder: Secondary | ICD-10-CM

## 2016-05-25 DIAGNOSIS — F1020 Alcohol dependence, uncomplicated: Secondary | ICD-10-CM

## 2016-05-25 DIAGNOSIS — F1320 Sedative, hypnotic or anxiolytic dependence, uncomplicated: Secondary | ICD-10-CM

## 2016-05-26 ENCOUNTER — Encounter (HOSPITAL_COMMUNITY): Payer: Self-pay | Admitting: Psychology

## 2016-05-26 DIAGNOSIS — F1320 Sedative, hypnotic or anxiolytic dependence, uncomplicated: Secondary | ICD-10-CM

## 2016-05-26 DIAGNOSIS — F1220 Cannabis dependence, uncomplicated: Secondary | ICD-10-CM
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
## 2016-05-29 ENCOUNTER — Other Ambulatory Visit (HOSPITAL_COMMUNITY): Payer: BC Managed Care – PPO | Admitting: Psychology

## 2016-05-29 ENCOUNTER — Encounter (HOSPITAL_COMMUNITY): Payer: Self-pay | Admitting: Medical

## 2016-05-29 ENCOUNTER — Other Ambulatory Visit (HOSPITAL_COMMUNITY): Payer: Self-pay | Admitting: Medical

## 2016-05-29 DIAGNOSIS — F1220 Cannabis dependence, uncomplicated: Secondary | ICD-10-CM

## 2016-05-29 DIAGNOSIS — F1994 Other psychoactive substance use, unspecified with psychoactive substance-induced mood disorder: Secondary | ICD-10-CM

## 2016-05-29 DIAGNOSIS — F900 Attention-deficit hyperactivity disorder, predominantly inattentive type: Secondary | ICD-10-CM

## 2016-05-29 DIAGNOSIS — J4530 Mild persistent asthma, uncomplicated: Secondary | ICD-10-CM

## 2016-05-29 DIAGNOSIS — F1020 Alcohol dependence, uncomplicated: Secondary | ICD-10-CM

## 2016-05-29 DIAGNOSIS — F19980 Other psychoactive substance use, unspecified with psychoactive substance-induced anxiety disorder: Secondary | ICD-10-CM

## 2016-05-29 DIAGNOSIS — F1320 Sedative, hypnotic or anxiolytic dependence, uncomplicated: Secondary | ICD-10-CM

## 2016-05-29 LAB — CANNABINOID (GC/MS), URINE
CANNABINOID UR: POSITIVE — AB
Carboxy THC (GC/MS): 24 ng/mL

## 2016-05-29 LAB — PRESCRIPTION ABUSE MONITORING 17P, URINE
6-Acetylmorphine, Urine: NEGATIVE ng/mL
AMPHETAMINE SCREEN URINE: NEGATIVE ng/mL
BARBITURATE SCREEN URINE: NEGATIVE ng/mL
BENZODIAZEPINE SCREEN, URINE: NEGATIVE ng/mL
BUPRENORPHINE, URINE: NEGATIVE ng/mL
CARISOPRODOL/MEPROBAMATE, UR: NEGATIVE ng/mL
COCAINE(METAB.)SCREEN, URINE: NEGATIVE ng/mL
Creatinine(Crt), U: 24.4 mg/dL (ref 20.0–300.0)
EDDP, URINE: NEGATIVE ng/mL
Fentanyl, Urine: NEGATIVE pg/mL
MDMA SCREEN, URINE: NEGATIVE ng/mL
Meperidine Screen, Urine: NEGATIVE ng/mL
Methadone Screen, Urine: NEGATIVE ng/mL
NITRITE URINE, QUANTITATIVE: NEGATIVE ug/mL
OXYCODONE+OXYMORPHONE UR QL SCN: NEGATIVE ng/mL
Opiate Scrn, Ur: NEGATIVE ng/mL
Ph of Urine: 6.6 (ref 4.5–8.9)
Phencyclidine Qn, Ur: NEGATIVE ng/mL
Propoxyphene Scrn, Ur: NEGATIVE ng/mL
SPECIFIC GRAVITY: 1.004
TRAMADOL SCREEN, URINE: NEGATIVE ng/mL
Tapentadol, Urine: NEGATIVE ng/mL

## 2016-05-29 LAB — ETHYL GLUCURONIDE, URINE: ETHYL GLUCURONIDE SCREEN, UR: NEGATIVE ng/mL

## 2016-05-29 NOTE — Progress Notes (Signed)
Patient ID: Craig Wiley, male   DOB: 02/07/1991, 25 y.o.   MRN: [id number]

## 2016-05-30 ENCOUNTER — Encounter (HOSPITAL_COMMUNITY): Payer: Self-pay | Admitting: Psychology

## 2016-05-30 NOTE — Progress Notes (Signed)
Daily Group Progress Note  Program: CD-IOP   05/30/2016 Drusilla Kanner [id number]  Diagnosis:  No diagnosis found.   Sobriety Date: 8/8  Group Time: 1-2:30 pm  Participation Level:   Behavioral Response: active, but then he left the group room for a significant period of time  Type of Therapy: Psycho-Education   Interventions: Strength-based  Topic: Psycho-Ed: the first part of group was spent with a visiting pharmacist, Einar Grad. She spent the hour providing education on the effects of drug on one's brain and other body parts. The guest speaker also identified the symptoms in early recovery from various chemicals and fielded questions from group members. A new group member was present for his first session.  A random drug test was also collected.   Group Time: 2:45- 4pm  Participation Level: Active  Behavioral Response: Sharing  Type of Therapy: Process Group  Interventions: Solution Focused  Topic: Process: the second part of group was spent in process. Members checked-in with their sobriety dates and identified what they had done to support their recovery since the last group session. They also shared about any struggles or challenges that have appeared since we last met. During this half of group, the new group member introduced himself and provided a little history about his drug use. Another member shared about a very awkward situation he had put himself into on Monday night. This disclosure invited lively discussion and helpful feedback from his fellow group members.   Summary: The patient appeared today after having missed the last 2 group sessions due to his anxiety. He admitted he had last smoked pot on the 7th. He had attended 4 12-step meetings since last week. The patient shared about his elevated levels of anxiety, but noted that the pot is only adding to his anxiety. He displayed some newfound motivation and stated, "I really want to commit to my  recovery". The patient was attentive during the psycho-ed, but during this part of group, he left the group room and did not return for over 15 minutes. The counselor went out to find him and the patient was in the hallway. He reported feeling very anxious and uncomfortable. He was encouraged to return to the group room and focus on his breathing. He did return and remained quiet, but observant. After the break and then the walking meditation, the patient reported that the walking meditation had been very pleasant. He had focused on the sound of the fountain and noticed the hot sun on his skin. In process, he admitted he had not filled the two scripts for anxiety written last week by the medical director. He was challenged about this and he stated his mother had been reluctant for him to take them. It was pointed out by this counselor that he might reconsider these meds considering his anxiety and the obstacle it represents for his life. This patient and the group were reminded of the power of one's breath and he admitted, "I stop breathing when I am anxious". The session ended with the patient engaged and sharing openly, however, if his anxiety continues to be an obstacle to his attendance or engagement, we will have to consider whether he needs something other than our program. We cannot help this patient if he cannot come to group and stay in it. A drug test was collected from him today.    UDS collected: Yes Results: positive for marijuana  AA/NA attended?: YesMonday, Tuesday, Thursday and Friday  Sponsor?: No  Brandon Melnick, LCAS 05/30/2016 9:49 AM

## 2016-05-30 NOTE — Progress Notes (Signed)
    Daily Group Progress Note  Program: CD-IOP   05/30/2016 Craig Wiley [id number]  Diagnosis:  Cannabis use disorder, severe, dependence ([place])  Alcohol use disorder, severe, dependence (HCC)  Sedative, hypnotic or anxiolytic use disorder, severe, dependence ([place])  Substance-induced anxiety disorder ([place])  Substance induced mood disorder ([place])  ADHD (attention deficit hyperactivity disorder), inattentive type  Mild persistent asthma in adult without complication   Sobriety Date: 05/23/16  Group Time: 1-2:30  Participation Level: Active  Behavioral Response: Appropriate, Sharing  Type of Therapy: Process Group  Interventions: CBT, Motivational Interviewing and Solution Focused  Topic:Counselors met with patients for group process session. One member successfully graduated from the program. Some members met with program director to discuss medication management. All members were active and engaged in the discussion concerning recovery from mind-altering drugs and alcohol.     Group Time: 2:45-4:00  Participation Level: Active  Behavioral Response: Appropriate and Sharing  Type of Therapy: Psycho-education Group  Interventions: Solution Focused and Meditation: guided meditation  Topic: Counselors met with patients for group psychoeducation session using Mindfulness-based Relapse Prevention. Counselor led a 10 minute meditation via YouTube which guided patients through a mindfulness experience. Patients commented that it was helpful and seemed shorter than expected. Most members were active and engaged. Additionally, counselor led a discussion on relapse prevention on recognizing "external triggers" such as people places and things that lead to returning to use. A handout from the [employer] curriculum was utilized. Drug tests were collected from most members.    Summary: Patient presented as well-groomed, active, and engaged in group today. He reported that he  attended 3 12 step meetings and had "cravings every night". He stated that he was pleasantly surprised to learn that cravings do not last more than 20 or 30 minutes. Other members agreed, smilingly. Patient shared that he feels an "energy boost" which he attributed to his new px of Wellbutrin. Patient reported on his experience in past tx and all that he realized about himself while he was inpatient. Pt continues to report anxiety from social comparison and feeling "behind" in his career and education. Patient met briefly with program director to discuss medication management. A drug test was collected from patient. Drug test results from a previous test were return; results were negative. Craig Wiley, Counselor   UDS collected: Yes Results: positive for marijuana (numbers decreasing)  AA/NA attended?: YesFriday and Saturday  Sponsor?: No   Craig Wiley, LCAS 05/30/2016 3:25 PM

## 2016-05-31 ENCOUNTER — Other Ambulatory Visit (HOSPITAL_COMMUNITY): Payer: BC Managed Care – PPO

## 2016-05-31 ENCOUNTER — Encounter (HOSPITAL_COMMUNITY): Payer: Self-pay | Admitting: Psychology

## 2016-05-31 LAB — PRESCRIPTION ABUSE MONITORING 17P, URINE
6-ACETYLMORPHINE, URINE: NEGATIVE ng/mL
AMPHETAMINE SCREEN URINE: NEGATIVE ng/mL
BARBITURATE SCREEN URINE: NEGATIVE ng/mL
BENZODIAZEPINE SCREEN, URINE: NEGATIVE ng/mL
Buprenorphine, Urine: NEGATIVE ng/mL
CANNABINOIDS UR QL SCN: NEGATIVE ng/mL
CREATININE(CRT), U: 18.5 mg/dL — AB (ref 20.0–300.0)
Carisoprodol/Meprobamate, Ur: NEGATIVE ng/mL
Cocaine (Metab) Scrn, Ur: NEGATIVE ng/mL
EDDP, URINE: NEGATIVE ng/mL
Fentanyl, Urine: NEGATIVE pg/mL
MDMA SCREEN, URINE: NEGATIVE ng/mL
Meperidine Screen, Urine: NEGATIVE ng/mL
Methadone Screen, Urine: NEGATIVE ng/mL
NITRITE URINE, QUANTITATIVE: NEGATIVE ug/mL
OXYCODONE+OXYMORPHONE UR QL SCN: NEGATIVE ng/mL
Opiate Scrn, Ur: NEGATIVE ng/mL
PH UR, DRUG SCRN: 7.2 (ref 4.5–8.9)
PHENCYCLIDINE QUANTITATIVE URINE: NEGATIVE ng/mL
PROPOXYPHENE SCREEN URINE: NEGATIVE ng/mL
SPECIFIC GRAVITY: 1.0035
Tapentadol, Urine: NEGATIVE ng/mL
Tramadol Screen, Urine: NEGATIVE ng/mL

## 2016-05-31 LAB — ETHYL GLUCURONIDE, URINE: Ethyl Glucuronide Screen, Ur: NEGATIVE ng/mL

## 2016-05-31 NOTE — Progress Notes (Signed)
Craig GoonMatthew Wiley is a 25 y.o. male patient. CD-IOP: Unexcused Absence. The patient did not appear for group nor did he phone to explain his absence. He has been complaining about his anxiety, buty has been resistant to taking the medications prescribed by our program dire tor to address the anxiety. His mother had phoned yesterday and asked if they could see another psychiatrist, Dr. Alanson Alyary Cottle? I assured her that this would be fine, but he is not going to prescribe any benzodiazepines to this patient if he knows this young man is an addict and alcoholic. [name] has abused Ativan, Alcohol and Cannabis for years and the large quantites of Ativan he has taken continues to wreak havoc on his brain in these early stages of recovery. We will wait to see if he returns or contacts us about tomorrow's group session. He will be discharged tomorrow if he does not return to group.         Charmian MuffAnn Melea Prezioso, LCAS

## 2016-06-01 ENCOUNTER — Other Ambulatory Visit (HOSPITAL_COMMUNITY): Payer: BC Managed Care – PPO | Admitting: Psychology

## 2016-06-04 ENCOUNTER — Encounter (HOSPITAL_COMMUNITY): Payer: Self-pay | Admitting: Psychology

## 2016-06-04 NOTE — Progress Notes (Signed)
    Daily Group Progress Note  Program: CD-IOP   06/04/2016 Drusilla Kanner [id number]  Diagnosis:  No diagnosis found.   Sobriety Date: 8/8  Group Time: 1-2:30 pm  Participation Level: Active  Behavioral Response: Appropriate and sharing  Type of Therapy: Process Group  Interventions: Assertiveness training  Process: the first half of group was spent in process. Members shared about current issues and concerns that they face in early recovery. One group member asked about how or when she should inform new friends that she is in recovery? This generated a lively discussion about the stigma of addiction as well as the growing realization among the public that addiction is a disease. Most everyone shared very strong concerns and fears about being excluded, stigmatized or worse, if others become aware that they are alcoholics and addicts. There was a general reluctance to openly acknowledge their illness and this issue was and will continue to be addressed g going forward.   Topic: Psycho-Ed  Group Time: 2:45-4pm  Participation Level: Active  Behavioral Response: Sharing  Type of Therapy: Psycho-education Group  Interventions: Strength-based  Topic: Psycho-Ed: The Neurobiology of Addiction. The second half of group was spent in a psycho-ed on the Neurobiology of addiction. Three short clips from a DVD were shown and, later, a power point describing some of the chemical interactions that occur when one is drinking or drugging. The session proved very informative. Members could relate to the symptoms identified during use as well as in early sobriety. The session emphasized the biological nature of the disease of addiction and the purpose, in part, is to help reduce or eliminate the shame associated with this illness.  Summary: The patient admitted he had not attended any meetings since we last met. He had not begun taking the medications, but continues to feel very anxious at  times. He could relate to his fellow group member who expressed concerns about how to tell friends or family about one's chemical dependency. The patient reported that many people don't understand addiction and are very judgemental. He admitted he feels a lot of shame about having this illness. The patient found the video very helpful in enhancing his understanding of what is going on in his brain as he is using. He was encouraged to remain active and engaged in the breathing exercises and meditation website he had downloaded on his phone. His anxiety has been very problematic for this young man, but we believe that after 5+ years of ingesting copious quantities of benzos, his brain is still in a very imbalanced state. He must learn to manage his anxiety through the evidence-based techniques that are so widely available and shown to be effective. We will continue to follow closely in the days ahead.   UDS collected: No  AA/NA attended?: No  Sponsor?: No   Brandon Melnick, LCAS 06/04/2016 6:37 PM

## 2016-06-05 ENCOUNTER — Other Ambulatory Visit (HOSPITAL_COMMUNITY): Payer: BC Managed Care – PPO

## 2016-06-06 ENCOUNTER — Encounter (HOSPITAL_COMMUNITY): Payer: Self-pay | Admitting: Psychology

## 2016-06-07 ENCOUNTER — Other Ambulatory Visit (HOSPITAL_COMMUNITY): Payer: BC Managed Care – PPO

## 2016-06-08 ENCOUNTER — Other Ambulatory Visit (HOSPITAL_COMMUNITY): Payer: BC Managed Care – PPO

## 2016-06-09 ENCOUNTER — Encounter (HOSPITAL_COMMUNITY): Payer: Self-pay | Admitting: Psychology

## 2016-06-09 NOTE — Progress Notes (Unsigned)
CD-IOP FAMILY SESSION:  Patient did not present for family session with counselors. Patient's mother and step father were in attendance. Patient's mother stated that pt was too anxious to attend session. Counselors and family called patient to secure consent to speak with both mother and stepfather w/o patient in attendance. Patient agreed to consent verbally and session continued w/o him. Parents presented their concerns about pt's treatment and his lack of motivation, varying degree of progress, and labile mood. Counselors agreed and discussed patient's status in group and recommended discharge from CD-IOP and his entry into a higher level of care due to pt inability to attend groups because of his high level of anxiety. Patient's insurance is set to run out at the end of the month and he will need services w/o insurance. Parent's were understanding and agreed to look for tx from another provider.

## 2016-06-12 ENCOUNTER — Other Ambulatory Visit (HOSPITAL_COMMUNITY): Payer: BC Managed Care – PPO

## 2016-06-14 ENCOUNTER — Other Ambulatory Visit (HOSPITAL_COMMUNITY): Payer: BC Managed Care – PPO

## 2016-06-15 ENCOUNTER — Other Ambulatory Visit (HOSPITAL_COMMUNITY): Payer: BC Managed Care – PPO

## 2016-06-21 ENCOUNTER — Other Ambulatory Visit (HOSPITAL_COMMUNITY): Payer: BC Managed Care – PPO

## 2016-06-22 ENCOUNTER — Other Ambulatory Visit (HOSPITAL_COMMUNITY): Payer: BC Managed Care – PPO

## 2016-06-26 ENCOUNTER — Other Ambulatory Visit (HOSPITAL_COMMUNITY): Payer: BC Managed Care – PPO

## 2016-06-28 ENCOUNTER — Other Ambulatory Visit (HOSPITAL_COMMUNITY): Payer: BC Managed Care – PPO

## 2016-06-29 ENCOUNTER — Other Ambulatory Visit (HOSPITAL_COMMUNITY): Payer: BC Managed Care – PPO

## 2016-07-03 ENCOUNTER — Other Ambulatory Visit (HOSPITAL_COMMUNITY): Payer: BC Managed Care – PPO

## 2016-07-05 ENCOUNTER — Other Ambulatory Visit (HOSPITAL_COMMUNITY): Payer: BC Managed Care – PPO

## 2016-07-06 ENCOUNTER — Other Ambulatory Visit (HOSPITAL_COMMUNITY): Payer: BC Managed Care – PPO

## 2016-07-10 ENCOUNTER — Other Ambulatory Visit (HOSPITAL_COMMUNITY): Payer: BC Managed Care – PPO

## 2016-07-12 ENCOUNTER — Other Ambulatory Visit (HOSPITAL_COMMUNITY): Payer: BC Managed Care – PPO

## 2016-07-13 ENCOUNTER — Other Ambulatory Visit (HOSPITAL_COMMUNITY): Payer: BC Managed Care – PPO

## 2016-07-17 ENCOUNTER — Other Ambulatory Visit (HOSPITAL_COMMUNITY): Payer: BC Managed Care – PPO

## 2016-07-19 ENCOUNTER — Other Ambulatory Visit (HOSPITAL_COMMUNITY): Payer: BC Managed Care – PPO

## 2016-07-19 ENCOUNTER — Ambulatory Visit: Payer: Self-pay | Admitting: Internal Medicine

## 2016-07-19 ENCOUNTER — Telehealth: Payer: Self-pay | Admitting: Internal Medicine

## 2016-07-19 MED ORDER — ALBUTEROL SULFATE HFA 108 (90 BASE) MCG/ACT IN AERS
2.0000 | INHALATION_SPRAY | Freq: Four times a day (QID) | RESPIRATORY_TRACT | 5 refills | Status: DC | PRN
Start: 2016-07-19 — End: 2016-09-19

## 2016-07-19 NOTE — Telephone Encounter (Signed)
Spoke with pt. He is needing a refill on his Proair inhaler. While speaking to the pt, he was not in distress. Rx has been sent in. Nothing further was needed.

## 2016-07-20 ENCOUNTER — Other Ambulatory Visit (HOSPITAL_COMMUNITY): Payer: BC Managed Care – PPO

## 2016-07-24 ENCOUNTER — Other Ambulatory Visit (HOSPITAL_COMMUNITY): Payer: BC Managed Care – PPO

## 2016-07-26 ENCOUNTER — Other Ambulatory Visit (HOSPITAL_COMMUNITY): Payer: BC Managed Care – PPO

## 2016-07-27 ENCOUNTER — Other Ambulatory Visit (HOSPITAL_COMMUNITY): Payer: BC Managed Care – PPO

## 2016-07-31 ENCOUNTER — Other Ambulatory Visit (HOSPITAL_COMMUNITY): Payer: BC Managed Care – PPO

## 2016-08-02 ENCOUNTER — Other Ambulatory Visit (HOSPITAL_COMMUNITY): Payer: BC Managed Care – PPO

## 2016-08-03 ENCOUNTER — Other Ambulatory Visit (HOSPITAL_COMMUNITY): Payer: BC Managed Care – PPO

## 2016-08-07 ENCOUNTER — Other Ambulatory Visit (HOSPITAL_COMMUNITY): Payer: BC Managed Care – PPO

## 2016-08-09 ENCOUNTER — Other Ambulatory Visit (HOSPITAL_COMMUNITY): Payer: BC Managed Care – PPO

## 2016-09-19 ENCOUNTER — Emergency Department (HOSPITAL_COMMUNITY)
Admission: EM | Admit: 2016-09-19 | Discharge: 2016-09-19 | Disposition: A | Discharge status: Home / Self Care | Payer: BC Managed Care – PPO | Attending: Emergency Medicine | Admitting: Emergency Medicine

## 2016-09-19 ENCOUNTER — Emergency Department (HOSPITAL_COMMUNITY): Payer: BC Managed Care – PPO

## 2016-09-19 ENCOUNTER — Ambulatory Visit (HOSPITAL_COMMUNITY)
Admission: EM | Admit: 2016-09-19 | Discharge: 2016-09-19 | Disposition: A | Discharge status: Home / Self Care | Payer: BC Managed Care – PPO | Attending: Family Medicine | Admitting: Family Medicine

## 2016-09-19 ENCOUNTER — Encounter (HOSPITAL_COMMUNITY): Payer: Self-pay | Admitting: Emergency Medicine

## 2016-09-19 ENCOUNTER — Encounter (HOSPITAL_COMMUNITY): Payer: Self-pay | Admitting: Family Medicine

## 2016-09-19 DIAGNOSIS — J4521 Mild intermittent asthma with (acute) exacerbation: Secondary | ICD-10-CM | POA: Insufficient documentation

## 2016-09-19 DIAGNOSIS — Z79899 Other long term (current) drug therapy: Secondary | ICD-10-CM | POA: Diagnosis not present

## 2016-09-19 DIAGNOSIS — F419 Anxiety disorder, unspecified: Secondary | ICD-10-CM | POA: Diagnosis not present

## 2016-09-19 DIAGNOSIS — F1290 Cannabis use, unspecified, uncomplicated: Secondary | ICD-10-CM

## 2016-09-19 DIAGNOSIS — F1210 Cannabis abuse, uncomplicated: Secondary | ICD-10-CM | POA: Insufficient documentation

## 2016-09-19 DIAGNOSIS — Z87891 Personal history of nicotine dependence: Secondary | ICD-10-CM | POA: Diagnosis not present

## 2016-09-19 DIAGNOSIS — F909 Attention-deficit hyperactivity disorder, unspecified type: Secondary | ICD-10-CM | POA: Insufficient documentation

## 2016-09-19 DIAGNOSIS — F410 Panic disorder [episodic paroxysmal anxiety] without agoraphobia: Secondary | ICD-10-CM | POA: Diagnosis not present

## 2016-09-19 DIAGNOSIS — R0602 Shortness of breath: Secondary | ICD-10-CM | POA: Diagnosis present

## 2016-09-19 MED ORDER — ALBUTEROL (5 MG/ML) CONTINUOUS INHALATION SOLN
10.0000 mg/h | INHALATION_SOLUTION | Freq: Once | RESPIRATORY_TRACT | Status: AC
  Administered 2016-09-19: 10 mg/h via RESPIRATORY_TRACT
  Filled 2016-09-19: qty 20

## 2016-09-19 MED ORDER — DEXAMETHASONE SODIUM PHOSPHATE 10 MG/ML IJ SOLN
10.0000 mg | Freq: Once | INTRAMUSCULAR | Status: AC
  Administered 2016-09-19: 10 mg via INTRAMUSCULAR
  Filled 2016-09-19: qty 1

## 2016-09-19 MED ORDER — ALBUTEROL SULFATE HFA 108 (90 BASE) MCG/ACT IN AERS
1.0000 | INHALATION_SPRAY | RESPIRATORY_TRACT | Status: DC | PRN
  Administered 2016-09-19: 2 via RESPIRATORY_TRACT
  Filled 2016-09-19: qty 6.7

## 2016-09-19 MED ORDER — ALBUTEROL SULFATE HFA 108 (90 BASE) MCG/ACT IN AERS: 2.0000 | INHALATION_SPRAY | Freq: Four times a day (QID) | RESPIRATORY_TRACT | 5 refills | Status: AC | PRN

## 2016-09-19 MED ORDER — PREDNISONE 10 MG (21) PO TBPK: 10.0000 mg | ORAL_TABLET | Freq: Every day | ORAL | 0 refills | Status: DC

## 2016-09-19 MED ORDER — LORAZEPAM 1 MG PO TABS
1.0000 mg | ORAL_TABLET | Freq: Once | ORAL | Status: AC
  Administered 2016-09-19: 1 mg via ORAL
  Filled 2016-09-19 (×2): qty 1

## 2016-09-19 MED ORDER — AEROCHAMBER PLUS FLO-VU MEDIUM MISC
1.0000 | Freq: Once | Status: AC
  Administered 2016-09-19: 1
  Filled 2016-09-19: qty 1

## 2016-09-19 MED ORDER — ALBUTEROL SULFATE (2.5 MG/3ML) 0.083% IN NEBU: 5.0000 mg | INHALATION_SOLUTION | Freq: Once | RESPIRATORY_TRACT | Status: DC

## 2016-09-19 MED ORDER — CLONAZEPAM 1 MG PO TABS: 1.0000 mg | ORAL_TABLET | Freq: Two times a day (BID) | ORAL | 0 refills | Status: DC

## 2016-09-19 MED ORDER — IPRATROPIUM-ALBUTEROL 0.5-2.5 (3) MG/3ML IN SOLN
3.0000 mL | Freq: Once | RESPIRATORY_TRACT | Status: AC
  Administered 2016-09-19: 3 mL via RESPIRATORY_TRACT
  Filled 2016-09-19: qty 3

## 2016-09-19 NOTE — Discharge Instructions (Signed)
Use medicine as needed. °

## 2016-09-19 NOTE — ED Provider Notes (Signed)
[facility]ER[facility]ENC[facility] DEPT Provider Note   CSN: [id number] Arrival date & time: 09/19/16  0744     History   Chief Complaint Chief Complaint  Patient presents with  . Shortness of Breath    HPI Craig Wiley is a 25 y.o. male.  Pt presents to the ED today with sob.  He has a hx of asthma and has been smoking a lot of marijuana for the past 2 weeks.  He has been using his inhaler without relief.  The pt denies any fevers.      Past Medical History:  Diagnosis Date  . Anxiety   . GERD (gastroesophageal reflux disease)   . Mild persistent asthma   . Phimosis     Patient Active Problem List   Diagnosis Date Noted  . ADHD (attention deficit hyperactivity disorder), inattentive type 05/10/2016  . Substance-induced anxiety disorder (HCC) 05/10/2016  . Cannabis use disorder, severe, dependence (HCC) 05/09/2016  . Alcohol use disorder, severe, dependence (HCC) 05/09/2016  . Sedative, hypnotic or anxiolytic use disorder, severe, dependence (HCC) 05/09/2016  . Substance induced mood disorder (HCC) 05/09/2016  . Mild persistent asthma in adult without complication 10/14/2015    Past Surgical History:  Procedure Laterality Date  . CIRCUMCISION N/A 02/02/2016   Procedure: CIRCUMCISION ADULT;  Surgeon: Sebastian Ache, MD;  Location: [facility];  Service: Urology;  Laterality: N/A;  . CIRCUMCISION, NON-NEWBORN  01/2016  . NO PAST SURGERIES         Home Medications    Prior to Admission medications   Medication Sig Start Date End Date Taking? Authorizing Provider  albuterol (PROAIR HFA) 108 (90 Base) MCG/ACT inhaler Inhale 2 puffs into the lungs every 6 (six) hours as needed for wheezing or shortness of breath. 07/19/16  Yes Nyoka Cowden, MD  budesonide-formoterol [facility]) 160-4.5 MCG/ACT inhaler Inhale 2 puffs into the lungs 2 (two) times daily. 11/12/15  Yes Nyoka Cowden, MD  FLUoxetine (PROZAC) 10 MG capsule Take 30 mg by mouth daily.   Yes Historical  Provider, MD  gabapentin (NEURONTIN) 800 MG tablet Take 1 tablet (800 mg total) by mouth 3 (three) times daily. Taper : 1/2 tablet 3x/day x 2 days then 2x/day x 2 days then 1x/day 2 days then 1/4 tablet x 3 days and stop Patient taking differently: Take 800 mg by mouth 3 (three) times daily.  05/17/16  Yes Court Joy, PA-C  buPROPion (WELLBUTRIN SR) 100 MG 12 hr tablet Take 1 tablet (100 mg total) by mouth 2 (two) times daily. Patient not taking: Reported on 09/19/2016 05/17/16   Court Joy, PA-C  divalproex (DEPAKOTE ER) 500 MG 24 hr tablet Take 1 tablet (500 mg total) by mouth daily. Patient not taking: Reported on 09/19/2016 05/17/16 05/17/17  Court Joy, PA-C  propranolol (INDERAL) 10 MG tablet Take 1 tablet (10 mg total) by mouth 2 (two) times daily. Patient not taking: Reported on 09/19/2016 05/10/16   Court Joy, PA-C    Family History Family History  Problem Relation Age of Onset  . Deep vein thrombosis Mother   . Asthma Maternal Grandfather   . Alcohol abuse Maternal Grandfather   . Alcohol abuse Father   . Alcohol abuse Maternal Grandmother   . Alcohol abuse Paternal Grandfather   . Alcohol abuse Paternal Grandmother     Social History Social History  Substance Use Topics  . Smoking status: Former Smoker    Packs/day: 0.03    Years: 5.00  Types: Cigarettes    Quit date: 01/15/2015  . Smokeless tobacco: Never Used  . Alcohol use 21.6 oz/week    36 Cans of beer per week     Comment: Weekend binge drinker, drank 3 times/week     Allergies   Patient has no known allergies.   Review of Systems Review of Systems  Respiratory: Positive for shortness of breath and wheezing.   All other systems reviewed and are negative.    Physical Exam Updated Vital Signs BP 119/83   Pulse 105   Temp 98.1 F (36.7 C) (Oral)   Resp 21   Ht 5\' 9"  (1.753 m)   Wt 235 lb (106.6 kg)   SpO2 100%   BMI 34.70 kg/m   Physical Exam  Constitutional: He is oriented to  person, place, and time.  HENT:  Head: Normocephalic and atraumatic.  Right Ear: External ear normal.  Left Ear: External ear normal.  Nose: Nose normal.  Mouth/Throat: Oropharynx is clear and moist.  Eyes: Conjunctivae and EOM are normal. Pupils are equal, round, and reactive to light.  Neck: Normal range of motion. Neck supple.  Cardiovascular: Normal rate, regular rhythm, normal heart sounds and intact distal pulses.   Pulmonary/Chest: Effort normal. He has wheezes.  Abdominal: Soft. Bowel sounds are normal.  Musculoskeletal: Normal range of motion.  Neurological: He is alert and oriented to person, place, and time.  Skin: He is diaphoretic.  Psychiatric: His mood appears anxious.  Nursing note and vitals reviewed.    ED Treatments / Results  Labs (all labs ordered are listed, but only abnormal results are displayed) Labs Reviewed - No data to display  EKG  EKG Interpretation  Date/Time:  Tuesday September 19 2016 07:57:38 EST Ventricular Rate:  86 PR Interval:    QRS Duration: 105 QT Interval:  392 QTC Calculation: 469 R Axis:   -10 Text Interpretation:  Sinus rhythm Borderline prolonged QT interval Baseline wander in lead(s) V1 Confirmed by [facility], Brealynn Contino ([id number]) on 09/19/2016 8:10:14 AM Also confirmed by [facility] MD, Tamiya Colello ([id number]), editor Whitney PostLOGAN, Cala BradfordKIMBERLY [phone]  on 09/19/2016 9:10:42 AM       Radiology No results found.  Procedures Procedures (including critical care time)  Medications Ordered in ED Medications  albuterol (PROVENTIL HFA;VENTOLIN HFA) 108 (90 Base) MCG/ACT inhaler 1-2 puff (not administered)  AEROCHAMBER PLUS FLO-VU MEDIUM MISC 1 each (not administered)  ipratropium-albuterol (DUONEB) 0.5-2.5 (3) MG/3ML nebulizer solution 3 mL (3 mLs Nebulization Given 09/19/16 0816)  albuterol (PROVENTIL,VENTOLIN) solution continuous neb (10 mg/hr Nebulization Given 09/19/16 0816)  dexamethasone (DECADRON) injection 10 mg (10 mg Intramuscular Given 09/19/16  0802)  LORazepam (ATIVAN) tablet 1 mg (1 mg Oral Given 09/19/16 0845)     Initial Impression / Assessment and Plan / ED Course  I have reviewed the triage vital signs and the nursing notes.  Pertinent labs & imaging results that were available during my care of the patient were reviewed by me and considered in my medical decision making (see chart for details).  Clinical Course     Pt is feeling much better after albuterol and decadron.  He was given ativan which has helped his anxiety.  Pt will be given an inhaler here with a spacer.  He is encouraged to stop smoking marijuana.  The pt has a wood burning fireplace at his house, and I told him to avoid that.  He knows to return if worse and to f/u with his pcp.  Final Clinical Impressions(s) / ED  Diagnoses   Final diagnoses:  Mild intermittent asthma with exacerbation  Marijuana use  Anxiety    New Prescriptions New Prescriptions   No medications on file     Jacalyn LefevreJulie Honestee Revard, MD 09/19/16 [phone]

## 2016-09-19 NOTE — ED Notes (Signed)
ED Provider at bedside. 

## 2016-09-19 NOTE — Discharge Instructions (Signed)
Do not smoke marijuana.  Avoid cigarette smoke.  Avoid open fireplaces.

## 2016-09-19 NOTE — ED Notes (Signed)
Patient refusing xray at present.  MD made aware.

## 2016-09-19 NOTE — ED Triage Notes (Signed)
Patient has a hx of asthma. Patient reports he has had a cold x1 week and states he has been smoking marijuana for the past 2 weeks. States he feels short of breath and believes he is having an asthma flair up.

## 2016-09-19 NOTE — ED Provider Notes (Signed)
[facility]    CSN: [id number] Arrival date & time: 09/19/16  1922     History   Chief Complaint Chief Complaint  Patient presents with  . Shortness of Breath  . Anxiety    HPI Craig GoonMatthew Wiley is a 25 y.o. male.   The history is provided by the patient and a parent.  Shortness of Breath  Severity:  Mild Onset quality:  Gradual Duration:  1 day Progression:  Improving Chronicity:  Recurrent Context: emotional upset   Context comment:  [facility], now having anxiety  regarding chest tightness. Relieved by:  Inhaler Associated symptoms: no abdominal pain, no chest pain, no fever, no PND and no wheezing   Anxiety  Associated symptoms include shortness of breath. Pertinent negatives include no chest pain and no abdominal pain.    Past Medical History:  Diagnosis Date  . Anxiety   . GERD (gastroesophageal reflux disease)   . Mild persistent asthma   . Phimosis     Patient Active Problem List   Diagnosis Date Noted  . ADHD (attention deficit hyperactivity disorder), inattentive type 05/10/2016  . Substance-induced anxiety disorder (HCC) 05/10/2016  . Cannabis use disorder, severe, dependence (HCC) 05/09/2016  . Alcohol use disorder, severe, dependence (HCC) 05/09/2016  . Sedative, hypnotic or anxiolytic use disorder, severe, dependence (HCC) 05/09/2016  . Substance induced mood disorder (HCC) 05/09/2016  . Mild persistent asthma in adult without complication 10/14/2015    Past Surgical History:  Procedure Laterality Date  . CIRCUMCISION N/A 02/02/2016   Procedure: CIRCUMCISION ADULT;  Surgeon: Sebastian Acheheodore Manny, MD;  [facility];  Service: Urology;  Laterality: N/A;  . CIRCUMCISION, NON-NEWBORN  01/2016  . NO PAST SURGERIES         Home Medications    Prior to Admission medications   Medication Sig Start Date End Date Taking? Authorizing Provider  albuterol (PROAIR HFA) 108 (90 Base)  MCG/ACT inhaler Inhale 2 puffs into the lungs every 6 (six) hours as needed for wheezing or shortness of breath. 09/19/16   Jacalyn LefevreJulie Haviland, MD  budesonide-formoterol [facility](SYMBICORT) 160-4.5 MCG/ACT inhaler Inhale 2 puffs into the lungs 2 (two) times daily. 11/12/15   Nyoka CowdenMichael B Wert, MD  buPROPion [facility](WELLBUTRIN SR) 100 MG 12 hr tablet Take 1 tablet (100 mg total) by mouth 2 (two) times daily. Patient not taking: Reported on 09/19/2016 05/17/16   Court Joyharles E Kober, PA-C  divalproex (DEPAKOTE ER) 500 MG 24 hr tablet Take 1 tablet (500 mg total) by mouth daily. Patient not taking: Reported on 09/19/2016 05/17/16 05/17/17  Court Joyharles E Kober, PA-C  FLUoxetine (PROZAC) 10 MG capsule Take 30 mg by mouth daily.    Historical Provider, MD  gabapentin (NEURONTIN) 800 MG tablet Take 1 tablet (800 mg total) by mouth 3 (three) times daily. Taper : 1/2 tablet 3x/day x 2 days then 2x/day x 2 days then 1x/day 2 days then 1/4 tablet x 3 days and stop Patient taking differently: Take 800 mg by mouth 3 (three) times daily.  05/17/16   Court Joyharles E Kober, PA-C  predniSONE (STERAPRED UNI-PAK 21 TAB) 10 MG (21) TBPK tablet Take 1 tablet (10 mg total) by mouth daily. Take 6 tabs by mouth daily  for 2 days, then 5 tabs for 2 days, then 4 tabs for 2 days, then 3 tabs for 2 days, 2 tabs for 2 days, then 1 tab by mouth daily for 2 days 09/19/16   Jacalyn LefevreJulie Haviland, MD  propranolol (INDERAL) 10 MG tablet Take 1 tablet (10 mg total) by mouth 2 (two) times daily. Patient not taking: Reported on 09/19/2016 05/10/16   Court Joy, PA-C    Family History Family History  Problem Relation Age of Onset  . Deep vein thrombosis Mother   . Asthma Maternal Grandfather   . Alcohol abuse Maternal Grandfather   . Alcohol abuse Father   . Alcohol abuse Maternal Grandmother   . Alcohol abuse Paternal Grandfather   . Alcohol abuse Paternal Grandmother     Social History Social History  Substance Use Topics  . Smoking status: Former Smoker    Packs/day: 0.03      Years: 5.00    Types: Cigarettes    Quit date: 01/15/2015  . Smokeless tobacco: Never Used  . Alcohol use 21.6 oz/week    36 Cans of beer per week     Comment: Weekend binge drinker, drank 3 times/week     Allergies   Patient has no known allergies.   Review of Systems Review of Systems  Constitutional: Negative.  Negative for fever.  HENT: Negative.   Respiratory: Positive for chest tightness and shortness of breath. Negative for wheezing.   Cardiovascular: Negative.  Negative for chest pain and PND.  Gastrointestinal: Negative.  Negative for abdominal pain.  All other systems reviewed and are negative.    Physical Exam Triage Vital Signs ED Triage Vitals [09/19/16 1951]  Enc Vitals Group     BP 146/85     Pulse Rate 113     Resp 18     Temp 98.2 F (36.8 C)     Temp src      SpO2 95 %     Weight      Height      Head Circumference      Peak Flow      Pain Score      Pain Loc      Pain Edu?      Excl. in GC?    No data found.   Updated Vital Signs BP 146/85   Pulse 113   Temp 98.2 F (36.8 C)   Resp 18   SpO2 95%   Visual Acuity Right Eye Distance:   Left Eye Distance:   Bilateral Distance:    Right Eye Near:   Left Eye Near:    Bilateral Near:     Physical Exam  Constitutional: He is oriented to person, place, and time. He appears well-developed and well-nourished. He appears distressed.  HENT:  Mouth/Throat: Oropharynx is clear and moist.  Eyes: Pupils are equal, round, and reactive to light.  Neck: Normal range of motion. Neck supple.  Cardiovascular: Normal rate, regular rhythm, normal heart sounds and intact distal pulses.   Pulmonary/Chest: Effort normal and breath sounds normal. He has no wheezes. He exhibits tenderness.  Lymphadenopathy:    He has no cervical adenopathy.  Neurological: He is alert and oriented to person, place, and time.  Skin: Skin is warm and dry.  Nursing note and vitals reviewed.    UC Treatments /  Results  Labs (all labs ordered are listed, but only abnormal results are displayed) Labs Reviewed - No data to display  EKG  EKG Interpretation None       Radiology No results found.  Procedures Procedures (including critical care time)  Medications Ordered in UC Medications - No data to display   Initial Impression / Assessment and Plan / UC Course  I  have reviewed the triage vital signs and the nursing notes.  Pertinent labs & imaging results that were available during my care of the patient were reviewed by me and considered in my medical decision making (see chart for details).  Clinical Course       Final Clinical Impressions(s) / UC Diagnoses   Final diagnoses:  None    New Prescriptions New Prescriptions   No medications on file     Linna HoffJames D Keyaira Clapham, MD 09/19/16 2012

## 2016-09-19 NOTE — ED Triage Notes (Signed)
Pt here for SOB and feeling anxious. sts he was seen at [facility] this am and feeling worse.

## 2016-09-21 ENCOUNTER — Ambulatory Visit (INDEPENDENT_AMBULATORY_CARE_PROVIDER_SITE_OTHER): Payer: BC Managed Care – PPO

## 2016-09-21 ENCOUNTER — Encounter (HOSPITAL_COMMUNITY): Payer: Self-pay | Admitting: Family Medicine

## 2016-09-21 ENCOUNTER — Ambulatory Visit (HOSPITAL_COMMUNITY)
Admission: EM | Admit: 2016-09-21 | Discharge: 2016-09-21 | Disposition: A | Discharge status: Home / Self Care | Payer: BC Managed Care – PPO | Attending: Emergency Medicine | Admitting: Emergency Medicine

## 2016-09-21 DIAGNOSIS — R064 Hyperventilation: Secondary | ICD-10-CM | POA: Diagnosis not present

## 2016-09-21 DIAGNOSIS — J4520 Mild intermittent asthma, uncomplicated: Secondary | ICD-10-CM | POA: Diagnosis not present

## 2016-09-21 DIAGNOSIS — M940 Chondrocostal junction syndrome [Tietze]: Secondary | ICD-10-CM

## 2016-09-21 DIAGNOSIS — F419 Anxiety disorder, unspecified: Secondary | ICD-10-CM

## 2016-09-21 NOTE — ED Provider Notes (Signed)
CSN: [id number]     Arrival date & time 09/21/16  1231 History   First MD Initiated Contact with Patient 09/21/16 1245     Chief Complaint  Patient presents with  . Shortness of Breath   (Consider location/radiation/quality/duration/timing/severity/associated sxs/prior Treatment) 25 year old male concerned about his breathing problems. States he has a history of asthma and his chest feels tight and believes he may be having an exacerbation. He was recently seen in the emergency department with same complaint, administered bronchodilators and steroids and was feeling better. He also has a history of severe anxiety was also administered Ativan. He has been using his albuterol every 4 hours and he states he is compliant with his prednisone. His father is him.  Has a history of anxiety, mild persistent asthma, GERD,alcohol abuse disorder, cannabis use as well as anxiolytics.  His father has mentioned that he believes he has hyperventilated in the past and proposes that as he hyperventilates and becomes dizzy that this in turns causes increased anxiety and increased anxiety causes him to have more hyperventilation producing a vicious cycle associated  with associated physiologic and psychologic changes.      Past Medical History:  Diagnosis Date  . Anxiety   . GERD (gastroesophageal reflux disease)   . Mild persistent asthma   . Phimosis    Past Surgical History:  Procedure Laterality Date  . CIRCUMCISION N/A 02/02/2016   Procedure: CIRCUMCISION ADULT;  Surgeon: Sebastian Acheheodore Manny, MD;  [facility] [place];  Service: Urology;  Laterality: N/A;  . CIRCUMCISION, NON-NEWBORN  01/2016  . NO PAST SURGERIES     Family History  Problem Relation Age of Onset  . Deep vein thrombosis Mother   . Asthma Maternal Grandfather   . Alcohol abuse Maternal Grandfather   . Alcohol abuse Father   . Alcohol abuse Maternal Grandmother   . Alcohol abuse Paternal Grandfather   . Alcohol abuse  Paternal Grandmother    Social History  Substance Use Topics  . Smoking status: Former Smoker    Packs/day: 0.03    Years: 5.00    Types: Cigarettes    Quit date: 01/15/2015  . Smokeless tobacco: Never Used  . Alcohol use 21.6 oz/week    36 Cans of beer per week     Comment: Weekend binge drinker, drank 3 times/week    Review of Systems  Constitutional: Negative for appetite change, diaphoresis and fever.  HENT: Positive for postnasal drip. Negative for congestion, drooling, ear discharge, ear pain and rhinorrhea.   Eyes: Negative.   Respiratory: Positive for cough and chest tightness.   Cardiovascular: Positive for chest pain.  Gastrointestinal: Negative.   Skin: Negative.   Psychiatric/Behavioral: The patient is nervous/anxious.     Allergies  Patient has no known allergies.  Home Medications   Prior to Admission medications   Medication Sig Start Date End Date Taking? Authorizing Provider  albuterol (PROAIR HFA) 108 (90 Base) MCG/ACT inhaler Inhale 2 puffs into the lungs every 6 (six) hours as needed for wheezing or shortness of breath. 09/19/16   Jacalyn LefevreJulie Haviland, MD  budesonide-formoterol [facility](SYMBICORT) 160-4.5 MCG/ACT inhaler Inhale 2 puffs into the lungs 2 (two) times daily. 11/12/15   Nyoka CowdenMichael B Wert, MD  buPROPion [facility](WELLBUTRIN SR) 100 MG 12 hr tablet Take 1 tablet (100 mg total) by mouth 2 (two) times daily. Patient not taking: Reported on 09/19/2016 05/17/16   Court Joyharles E Kober, PA-C  clonazePAM (KLONOPIN) 1 MG tablet Take 1 tablet (1 mg total) by mouth  2 (two) times daily. 09/19/16   Linna HoffJames D Kindl, MD  divalproex (DEPAKOTE ER) 500 MG 24 hr tablet Take 1 tablet (500 mg total) by mouth daily. Patient not taking: Reported on 09/19/2016 05/17/16 05/17/17  Court Joyharles E Kober, PA-C  FLUoxetine (PROZAC) 10 MG capsule Take 30 mg by mouth daily.    Historical Provider, MD  gabapentin (NEURONTIN) 800 MG tablet Take 1 tablet (800 mg total) by mouth 3 (three) times daily. Taper : 1/2 tablet 3x/day x 2  days then 2x/day x 2 days then 1x/day 2 days then 1/4 tablet x 3 days and stop Patient taking differently: Take 800 mg by mouth 3 (three) times daily.  05/17/16   Court Joyharles E Kober, PA-C  predniSONE (STERAPRED UNI-PAK 21 TAB) 10 MG (21) TBPK tablet Take 1 tablet (10 mg total) by mouth daily. Take 6 tabs by mouth daily  for 2 days, then 5 tabs for 2 days, then 4 tabs for 2 days, then 3 tabs for 2 days, 2 tabs for 2 days, then 1 tab by mouth daily for 2 days 09/19/16   Jacalyn LefevreJulie Haviland, MD  propranolol (INDERAL) 10 MG tablet Take 1 tablet (10 mg total) by mouth 2 (two) times daily. Patient not taking: Reported on 09/19/2016 05/10/16   Court Joyharles E Kober, PA-C   Meds Ordered and Administered this Visit  Medications - No data to display  BP 131/79   Pulse 71   Temp 98.3 F (36.8 C)   Resp 20   SpO2 99%  No data found.   Physical Exam  Constitutional: He is oriented to person, place, and time. He appears well-developed and well-nourished. No distress.  HENT:  Head: Normocephalic and atraumatic.  Right Ear: External ear normal.  Left Ear: External ear normal.  Oropharynx with scant clear PND.  Eyes: EOM are normal.  Neck: Normal range of motion. Neck supple.  Cardiovascular: Normal rate, regular rhythm, normal heart sounds and intact distal pulses.   Pulmonary/Chest: Breath sounds normal. He is in respiratory distress. He has no wheezes. He has no rales. He exhibits tenderness.  Positive reproducible chest wall tenderness along the bilateral parasternal borders as well as the bilateral costal margins.  Lungs are perfectly clear. Excellent air movement and chest expansion. No adventitious sounds. During the interview the patient is taking frequent deep breaths in sighs.  Musculoskeletal: He exhibits no edema or deformity.  Neurological: He is alert and oriented to person, place, and time. No cranial nerve deficit.  Skin: Skin is warm and dry.  Nursing note and vitals reviewed.   Urgent Care  Course   Clinical Course     Procedures (including critical care time)  Labs Review Labs Reviewed - No data to display  Imaging Review Dg Chest 2 View  Result Date: 09/21/2016 CLINICAL DATA:  Shortness of breath for 10 days, history of asthma EXAM: CHEST  2 VIEW COMPARISON:  None. FINDINGS: The heart size and mediastinal contours are within normal limits. Both lungs are clear. The visualized skeletal structures are unremarkable. IMPRESSION: No active cardiopulmonary disease. Electronically Signed   By: Natasha MeadLiviu  Pop M.D.   On: 09/21/2016 13:30     Visual Acuity Review  Right Eye Distance:   Left Eye Distance:   Bilateral Distance:    Right Eye Near:   Left Eye Near:    Bilateral Near:         MDM   1. Costochondritis   2. Hyperventilation   3. Mild intermittent asthma without complication  4. Anxiety    Your chest x-ray is completely normal today. Your lungs are perfectly clear. Lung function with peak flow is exceptional. There is no sign of lung impairment or disease today. You have chest wall tenderness likely the reason you have chest wall pain. You are also hyperventilating which is likely associated with anxiety. Continue to take your medications as directed. Be sure not to overuse your inhaler as this can increase your anxiety. Follow-up with her primary care doctor as needed.     Hayden Rasmussen, NP 09/21/16 1352

## 2016-09-21 NOTE — Discharge Instructions (Signed)
Your chest x-ray is completely normal today. Your lungs are perfectly clear. Lung function with peak flow is exceptional. There is no sign of lung impairment or disease today. You have chest wall tenderness likely the reason. To have chest wall pain. You are also hyperventilating which is likely associated with anxiety. Continue to take your medications as directed. Be sure not to overuse your inhaler as this can increase your anxiety. Follow-up with her primary care doctor as needed.

## 2016-09-21 NOTE — ED Notes (Signed)
Peak Flow:  440/505/530

## 2016-09-21 NOTE — ED Triage Notes (Signed)
Pt here for continued issues with his breathing. sts the same as the other day. sts tightness in chest and SOB. sts using inhaler, prednisone.

## 2016-09-22 ENCOUNTER — Encounter: Payer: Self-pay | Admitting: Pulmonary Disease

## 2016-09-22 ENCOUNTER — Ambulatory Visit (INDEPENDENT_AMBULATORY_CARE_PROVIDER_SITE_OTHER): Payer: BC Managed Care – PPO | Admitting: Pulmonary Disease

## 2016-09-22 VITALS — BP 128/82 | HR 70 | Ht 69.0 in | Wt 224.8 lb

## 2016-09-22 DIAGNOSIS — R062 Wheezing: Secondary | ICD-10-CM | POA: Diagnosis not present

## 2016-09-22 DIAGNOSIS — J45901 Unspecified asthma with (acute) exacerbation: Secondary | ICD-10-CM | POA: Diagnosis not present

## 2016-09-22 LAB — NITRIC OXIDE

## 2016-09-22 MED ORDER — FLUTICASONE PROPIONATE 50 MCG/ACT NA SUSP: 2.0000 | Freq: Every day | NASAL | 2 refills | Status: DC

## 2016-09-22 MED ORDER — MONTELUKAST SODIUM 10 MG PO TABS: 10.0000 mg | ORAL_TABLET | Freq: Every day | ORAL | 5 refills | Status: DC

## 2016-09-22 NOTE — Progress Notes (Signed)
Current Outpatient Prescriptions on File Prior to Visit  Medication Sig  . albuterol (PROAIR HFA) 108 (90 Base) MCG/ACT inhaler Inhale 2 puffs into the lungs every 6 (six) hours as needed for wheezing or shortness of breath.  . budesonide-formoterol (SYMBICORT) 160-4.5 MCG/ACT inhaler Inhale 2 puffs into the lungs 2 (two) times daily.  Marland Kitchen. FLUoxetine (PROZAC) 10 MG capsule Take 30 mg by mouth daily.   No current facility-administered medications on file prior to visit.      Chief Complaint  Patient presents with  . Acute Visit    Was in the ER and urgent care 3 times in the past week for an asthma flare up. Chest still feels tight. Pt. has been using inhalers.      Tests FeNO 09/22/16 >> 8  Past medical history GERD, Anxiety  Past surgical history, Family history, Social history, Allergies all reviewed.  Vital Signs BP 128/82 (BP Location: Left Arm, Patient Position: Sitting, Cuff Size: Normal)   Pulse 70   Ht 5\' 9"  (1.753 m)   Wt 224 lb 12.8 oz (102 kg)   SpO2 99%   BMI 33.20 kg/m   History of Present Illness Craig Wiley is a 25 y.o. male with asthma.  He is followed by Dr. Sherene SiresWert.  He started smoking marijuana again recently.  He says he gets [place] from [place] and uses this for anxiety.    He has since developed cough with black sputum and chest tightness.  He was having wheeze.  He has gone to urgent care several times in the past week.  He was started on prednisone.  This helped, but caused more trouble with anxiety.  As a result he stopped prednisone.  He is using symbicort bid, and proair more frequently.  He has some sinus congestion with post nasal drip.  He has not had fever, skin rash, or gland swelling.  He got nauseous after coughing spell yesterday.  Physical Exam  General - No distress ENT - No sinus tenderness, clear nasal drainage, no oral exudate, no LAN Cardiac - s1s2 regular, no murmur Chest - No wheeze/rales/dullness Back - No focal  tenderness Abd - Soft, non-tender Ext - No edema Neuro - Normal strength Skin - No rashes Psych - normal mood, and behavior   Assessment/Plan  Acute asthma exacerbation. - likely related to recent use of THC >> advised him to avoid all forms of inhalants that could trigger his asthma - he is reluctant to use corticosteroids due to concerns about his anxiety - will add singular - continue symbicort and prn proair  Post nasal drip. - will have him use nasal irrigation and flonase   Patient Instructions  Singulair 10 mg pill nightly Saline nasal spray daily for next two weeks, then as needed Flonase 1 spray each nostril daily for next two weeks, then as needed  Follow up with Dr. Sherene SiresWert or Nurse Practitioner in 4 weeks    Coralyn HellingVineet Dionicia Cerritos, MD [facility] Pager:  [phone] 09/22/2016, 12:27 PM

## 2016-09-22 NOTE — Patient Instructions (Signed)
Singulair 10 mg pill nightly Saline nasal spray daily for next two weeks, then as needed Flonase 1 spray each nostril daily for next two weeks, then as needed  Follow up with Dr. Sherene SiresWert or Nurse Practitioner in 4 weeks

## 2016-10-20 ENCOUNTER — Other Ambulatory Visit: Payer: Self-pay | Admitting: Internal Medicine

## 2016-10-23 ENCOUNTER — Ambulatory Visit: Payer: Self-pay | Admitting: Adult Health

## 2016-11-04 ENCOUNTER — Ambulatory Visit (HOSPITAL_COMMUNITY)
Admission: EM | Admit: 2016-11-04 | Discharge: 2016-11-04 | Disposition: A | Discharge status: Home / Self Care | Payer: BC Managed Care – PPO | Attending: Family Medicine | Admitting: Family Medicine

## 2016-11-04 ENCOUNTER — Encounter (HOSPITAL_COMMUNITY): Payer: Self-pay

## 2016-11-04 DIAGNOSIS — Z1159 Encounter for screening for other viral diseases: Secondary | ICD-10-CM

## 2016-11-04 DIAGNOSIS — B009 Herpesviral infection, unspecified: Secondary | ICD-10-CM | POA: Diagnosis not present

## 2016-11-04 MED ORDER — VALACYCLOVIR HCL 500 MG PO TABS: 500.0000 mg | ORAL_TABLET | Freq: Two times a day (BID) | ORAL | 11 refills | Status: AC

## 2016-11-04 NOTE — ED Triage Notes (Signed)
[facility], pt states this condition is recurrent and this is the second episode.

## 2016-11-04 NOTE — Discharge Instructions (Signed)
Return if symptoms persist 

## 2016-11-04 NOTE — ED Provider Notes (Signed)
[facility]    CSN: [id number] Arrival date & time: 11/04/16  1204     History   Chief Complaint Chief Complaint  Patient presents with  . Exposure to STD    herpes    HPI Craig Wiley is a 26 y.o. male.   Is a 26 year old man who presents with 4 days perianal herpes simplex virus. His last episode was 6 years ago. He now has burning and irritation for about 3-4 days.      Past Medical History:  Diagnosis Date  . Anxiety   . GERD (gastroesophageal reflux disease)   . Mild persistent asthma   . Phimosis     Patient Active Problem List   Diagnosis Date Noted  . ADHD (attention deficit hyperactivity disorder), inattentive type 05/10/2016  . Substance-induced anxiety disorder (HCC) 05/10/2016  . Cannabis use disorder, severe, dependence (HCC) 05/09/2016  . Alcohol use disorder, severe, dependence (HCC) 05/09/2016  . Sedative, hypnotic or anxiolytic use disorder, severe, dependence (HCC) 05/09/2016  . Substance induced mood disorder (HCC) 05/09/2016  . Mild persistent asthma in adult without complication 10/14/2015    Past Surgical History:  Procedure Laterality Date  . CIRCUMCISION N/A 02/02/2016   Procedure: CIRCUMCISION ADULT;  Surgeon: Sebastian Ache, MD;  Location: [facility];  Service: Urology;  Laterality: N/A;  . CIRCUMCISION, NON-NEWBORN  01/2016  . NO PAST SURGERIES         Home Medications    Prior to Admission medications   Medication Sig Start Date End Date Taking? Authorizing Provider  budesonide-formoterol (SYMBICORT) 160-4.5 MCG/ACT inhaler Inhale 2 puffs into the lungs 2 (two) times daily. 11/12/15  Yes Nyoka Cowden, MD  FLUoxetine (PROZAC) 10 MG capsule Take 30 mg by mouth daily.   Yes Historical Provider, MD  gabapentin (NEURONTIN) 800 MG tablet Take 800 mg by mouth 3 (three) times daily.   Yes Historical Provider, MD  SYMBICORT 160-4.5 MCG/ACT inhaler INHALE 2 PUFFS BY MOUTH FIRST THING IN THE MORNING AND  ANOTHER 2 PUFFS 12 HOURS LATER 10/20/16  Yes Nyoka Cowden, MD  albuterol (PROAIR HFA) 108 (90 Base) MCG/ACT inhaler Inhale 2 puffs into the lungs every 6 (six) hours as needed for wheezing or shortness of breath. 09/19/16   Jacalyn Lefevre, MD  fluticasone (FLONASE) 50 MCG/ACT nasal spray Place 2 sprays into both nostrils daily. 09/22/16   Coralyn Helling, MD  montelukast (SINGULAIR) 10 MG tablet Take 1 tablet (10 mg total) by mouth at bedtime. 09/22/16   Coralyn Helling, MD  valACYclovir (VALTREX) 500 MG tablet Take 1 tablet (500 mg total) by mouth 2 (two) times daily. 11/04/16   Elvina Sidle, MD    Family History Family History  Problem Relation Age of Onset  . Deep vein thrombosis Mother   . Asthma Maternal Grandfather   . Alcohol abuse Maternal Grandfather   . Alcohol abuse Father   . Alcohol abuse Maternal Grandmother   . Alcohol abuse Paternal Grandfather   . Alcohol abuse Paternal Grandmother     Social History Social History  Substance Use Topics  . Smoking status: Former Smoker    Packs/day: 0.03    Years: 5.00    Types: Cigarettes    Quit date: 01/15/2015  . Smokeless tobacco: Never Used  . Alcohol use 21.6 oz/week    36 Cans of beer per week     Comment: Weekend binge drinker, drank 3 times/week     Allergies   Patient has no known allergies.  Review of Systems Review of Systems  Constitutional: Negative.   HENT: Negative.   Respiratory: Negative.   Gastrointestinal: Positive for rectal pain.     Physical Exam Triage Vital Signs ED Triage Vitals  Enc Vitals Group     BP 11/04/16 1243 128/62     Pulse Rate 11/04/16 1243 84     Resp 11/04/16 1243 17     Temp 11/04/16 1243 98 F (36.7 C)     Temp Source 11/04/16 1243 Oral     SpO2 11/04/16 1243 100 %     Weight --      Height --      Head Circumference --      Peak Flow --      Pain Score 11/04/16 1247 8     Pain Loc --      Pain Edu? --      Excl. in GC? --    No data found.   Updated Vital  Signs BP 128/62 (BP Location: Left Arm)   Pulse 84   Temp 98 F (36.7 C) (Oral)   Resp 17   SpO2 100%   Physical Exam  Constitutional: He appears well-developed and well-nourished.  HENT:  Right Ear: External ear normal.  Left Ear: External ear normal.  Mouth/Throat: Oropharynx is clear and moist.  Eyes: Conjunctivae and EOM are normal.  Neck: Normal range of motion. Neck supple.  Skin: Skin is warm and dry. Rash noted.  Few small red papules in the perianal region.  Nursing note and vitals reviewed.    UC Treatments / Results  Labs (all labs ordered are listed, but only abnormal results are displayed) Labs Reviewed - No data to display  EKG  EKG Interpretation None       Radiology No results found.  Procedures Procedures (including critical care time)  Medications Ordered in UC Medications - No data to display   Initial Impression / Assessment and Plan / UC Course  I have reviewed the triage vital signs and the nursing notes.  Pertinent labs & imaging results that were available during my care of the patient were reviewed by me and considered in my medical decision making (see chart for details).     Final Clinical Impressions(s) / UC Diagnoses   Final diagnoses:  Herpes simplex virus (HSV) type I or type II DNA not detected by PCR    New Prescriptions New Prescriptions   VALACYCLOVIR (VALTREX) 500 MG TABLET    Take 1 tablet (500 mg total) by mouth 2 (two) times daily.     Elvina SidleKurt Ayrton Mcvay, MD 11/04/16 1257

## 2016-11-25 ENCOUNTER — Other Ambulatory Visit: Payer: Self-pay | Admitting: Internal Medicine

## 2016-11-27 ENCOUNTER — Encounter: Payer: Self-pay | Admitting: Internal Medicine

## 2016-11-27 ENCOUNTER — Ambulatory Visit (INDEPENDENT_AMBULATORY_CARE_PROVIDER_SITE_OTHER): Payer: BC Managed Care – PPO | Admitting: Internal Medicine

## 2016-11-27 VITALS — BP 128/84 | HR 69 | Ht 69.0 in | Wt 223.0 lb

## 2016-11-27 DIAGNOSIS — J4530 Mild persistent asthma, uncomplicated: Secondary | ICD-10-CM

## 2016-11-27 MED ORDER — BUDESONIDE-FORMOTEROL FUMARATE 160-4.5 MCG/ACT IN AERO: INHALATION_SPRAY | RESPIRATORY_TRACT | 5 refills | Status: DC

## 2016-11-27 NOTE — Patient Instructions (Addendum)
Plan A = Automatic = Symbicort 160 Take 2 puffs first thing in am and then another 2 puffs about 12 hours later.    Work on maintaining perfect inhaler technique:  relax and gently blow all the way out then take a nice smooth deep breath back in, triggering the inhaler at same time you start breathing in.  Hold for up to 5 seconds if you can. Blow out thru nose. Rinse and gargle with water when done     Plan B = Backup Only use your albuterol as a rescue medication to be used if you can't catch your breath by resting or doing a relaxed purse lip breathing pattern.  - The less you use it, the better it will work when you need it. - Ok to use the inhaler up to 2 puffs  every 4 hours if you must but call for appointment immediately if use goes up over your usual need - Don't leave home without it !!  (think of it like the spare tire for your car)     Please schedule a follow up visit in 6 months but call sooner if needed

## 2016-11-27 NOTE — Progress Notes (Signed)
Subjective:    Patient ID: Sampson GoonMatthew Fanara, male    DOB: 01/16/1991, 26 y.o.   MRN: [id number]    Brief patient profile:  25 yowm occ smokes MJ born 1 m premature and 4 days in nursery s vent or 02 then "croup" age 89 /recurrent cough and wheeze finally labeled as asthma and well controlled on prn saba flared with colds but since 07/2015 much more persistent symptoms some better on symbicort but only uses one bid and referred to pulmonary clinic 10/14/2015 by Satira SarkAron Morrow for asthma eval    History of Present Illness  10/15/2015 1st [facility] office visit/ Wert  On symb 80 one bid avg saba 2x daily  Chief Complaint  Patient presents with  . Pulmonary Consult    Referred by Dr. Kateri PlummerMorrow. Pt states dxed with Dr. Kateri PlummerMorrow. Pt states dxed with Asthma at age 89. He c/o chest tightness and SOB for the past year, worse for the past 2 months. He states that his symptoms are present at all times, but worse with exertion. He has hx of hospitalized at age 89 and [age] for his asthma.   rec Symbicort  160 Take 2 puffs first thing in am and then another 2 puffs about 12 hours later.  Only use your albuterol as a rescue medication  Please remember to go to the lab  department     11/12/2015  f/u ov/Wert re: chronic asthma on symb 160 2bid and only saba before ex  Chief Complaint  Patient presents with  . Follow-up    Pt states that his breathing is doing well.   just using one pff saba pre ex  Used mj inhaled for anxiety but no longer inhaling any rec No change in recs Please schedule a follow up visit in 3 months but call sooner if needed with pfts    01/18/2016  f/u ov/Wert re:  Chronic asthma maint on symbicort 160 2bid Chief Complaint  Patient presents with  . Follow-up    Had PFT today-No complaints  Only using saba before working out intensely about 9 h p am symb  rec Plan A = Automatic = symbicort 160 Take 2 puffs first thing in am and then another 2 puffs about 12 hours later.  Plan  B = Backup Only use your albuterol (Proair)  as a rescue medication     09/22/16 post hosp f/u [facility] Singulair 10 mg pill nightly Saline nasal spray daily for next two weeks, then as needed Flonase 1 spray each nostril daily for next two weeks, then as needed   11/27/2016  f/u ov/Wert re:  Chief Complaint  Patient presents with  . Follow-up    Breathing is doing well. He is out of symbicort and needs a refill.     Using albuterol daily before ex daily / never tries s   No obvious day to day or daytime variability or assoc excess/ purulent sputum or mucus plugs or hemoptysis or cp or chest tightness, subjective wheeze or overt sinus or hb symptoms. No unusual exp hx or h/o childhood pna/ asthma or knowledge of premature birth.  Sleeping ok without nocturnal  or early am exacerbation  of respiratory  c/o's or need for noct saba. Also denies any obvious fluctuation of symptoms with weather or environmental changes or other aggravating or alleviating factors except as outlined above   Current Medications, Allergies, Complete Past Medical History, Past Surgical History, Family History, and Social History were reviewed in [facility]  Link electronic medical record.  ROS  The following are not active complaints unless bolded sore throat, dysphagia, dental problems, itching, sneezing,  nasal congestion or excess/ purulent secretions, ear ache,   fever, chills, sweats, unintended wt loss, classically pleuritic or exertional cp,  orthopnea pnd or leg swelling, presyncope, palpitations, abdominal pain, anorexia, nausea, vomiting, diarrhea  or change in bowel or bladder habits, change in stools or urine, dysuria,hematuria,  rash, arthralgias, visual complaints, headache, numbness, weakness or ataxia or problems with walking or coordination,  change in mood/affect or memory.                     Objective:   Physical Exam  amb muscular  wm nad   11/27/2016        223   01/18/2016         228     11/12/15 227 lb 3.2 oz (103.057 kg)  10/14/15 218 lb 3.2 oz (98.975 kg)  10/06/15 210 lb (95.255 kg)    Vital signs reviewed   - Note on arrival 02 sats  99% on RA       HEENT: nl dentition, turbinates, and oropharynx. Nl external ear canals without cough reflex   NECK :  without JVD/Nodes/TM/ nl carotid upstrokes bilaterally   LUNGS: no acc muscle use,  Nl contour chest completely clear to  A and P    CV:  RRR  no s3 or murmur or increase in P2, no edema   ABD:  soft and nontender with nl inspiratory excursion in the supine position. No bruits or organomegaly, bowel sounds nl  MS:  Nl gait/ ext warm without deformities, calf tenderness, cyanosis or clubbing No obvious joint restrictions   SKIN: warm and dry without lesions    NEURO:  alert, approp, nl sensorium with  no motor deficits                 Assessment & Plan:

## 2016-11-28 NOTE — Assessment & Plan Note (Addendum)
10/14/2015    increase symbicort to 160 2bid  - allergy profile 10/14/2015 >  Eos 0.3 /  IgE  52  Ragweed > cat > dog  - PFT's  01/18/2016   wnl including fef 25-75 s rx at all am of pfts   - 11/27/2016  After extensive coaching HFA effectiveness =    90% - Spirometry 11/27/2016  Minimal reduction in mid flows only p am symbicort 160    All goals of chronic asthma control met including optimal function and elimination of symptoms with minimal need for rescue therapy.  Advised does need to time late afternoon work outs to where he takes the symbicort a few hours earlier than q 12h so he doesn't depend on the saba daily for workouts  Contingencies discussed in full including contacting this office immediately if not controlling the symptoms using the rule of two's.      I had an extended discussion with the patient reviewing all relevant studies completed to date and  lasting 15 to 20 minutes of a 25 minute visit    Each maintenance medication was reviewed in detail including most importantly the difference between maintenance and prns and under what circumstances the prns are to be triggered using an action plan format that is not reflected in the computer generated alphabetically organized AVS.    Please see AVS for specific instructions unique to this visit that I personally wrote and verbalized to the the pt in detail and then reviewed with pt  by my nurse highlighting any  changes in therapy recommended at today's visit to their plan of care.

## 2016-12-10 ENCOUNTER — Ambulatory Visit (HOSPITAL_COMMUNITY)
Admission: EM | Admit: 2016-12-10 | Discharge: 2016-12-10 | Disposition: A | Discharge status: Home / Self Care | Payer: BC Managed Care – PPO | Attending: Family Medicine | Admitting: Family Medicine

## 2016-12-10 ENCOUNTER — Encounter (HOSPITAL_COMMUNITY): Payer: Self-pay | Admitting: *Deleted

## 2016-12-10 DIAGNOSIS — R229 Localized swelling, mass and lump, unspecified: Secondary | ICD-10-CM

## 2016-12-10 NOTE — Discharge Instructions (Signed)
You appear to have a nodule on the scalp. It may be a node or gland, but does not appear to be anything emergent at this point. If it continues to enlarge or becomes painful hot or red then suggest f/u with PCP or Dermatology for biopsy. Otherwise would watch for now.

## 2016-12-10 NOTE — ED Triage Notes (Signed)
States noticed "bump" to left posterior scalp approx 2-3 wks ago.  Area non-tender to palpation.

## 2016-12-10 NOTE — ED Provider Notes (Signed)
CSN: [id number]     Arrival date & time 12/10/16  1550 History   First MD Initiated Contact with Patient 12/10/16 1603     Chief Complaint  Patient presents with  . Mass   (Consider location/radiation/quality/duration/timing/severity/associated sxs/prior Treatment) 26 yo caucasian male with a past medical history of anxeity reports that he noticed a "bump" to his left scalp 3 weeks ago. No trauma. It has really began to bother him from a mental standpoint over the last few days and therefore wanted to seek an opinion. He denies a change in size.  It is non-painful. No drainage. No fevers. No weight loss.      Past Medical History:  Diagnosis Date  . Anxiety   . GERD (gastroesophageal reflux disease)   . Mild persistent asthma   . Phimosis    Past Surgical History:  Procedure Laterality Date  . CIRCUMCISION N/A 02/02/2016   Procedure: CIRCUMCISION ADULT;  Surgeon: Sebastian Acheheodore Manny, MD;  [facility];  Service: Urology;  Laterality: N/A;  . CIRCUMCISION, NON-NEWBORN  01/2016  . NO PAST SURGERIES     Family History  Problem Relation Age of Onset  . Deep vein thrombosis Mother   . Asthma Maternal Grandfather   . Alcohol abuse Maternal Grandfather   . Alcohol abuse Father   . Alcohol abuse Maternal Grandmother   . Alcohol abuse Paternal Grandfather   . Alcohol abuse Paternal Grandmother    Social History  Substance Use Topics  . Smoking status: Former Smoker    Packs/day: 0.03    Years: 5.00    Types: Cigarettes    Quit date: 01/15/2015  . Smokeless tobacco: Never Used  . Alcohol use No    Review of Systems  All other systems reviewed and are negative.   Allergies  Patient has no known allergies.  Home Medications   Prior to Admission medications   Medication Sig Start Date End Date Taking? Authorizing Provider  albuterol (PROAIR HFA) 108 (90 Base) MCG/ACT inhaler Inhale 2 puffs into the lungs every 6 (six) hours as needed for wheezing or  shortness of breath. 09/19/16  Yes Jacalyn LefevreJulie Haviland, MD  budesonide-formoterol [facility](SYMBICORT) 160-4.5 MCG/ACT inhaler Take 2 puffs first thing in am and then another 2 puffs about 12 hours later. 11/27/16  Yes Nyoka CowdenMichael B Wert, MD  FLUoxetine (PROZAC) 10 MG capsule Take 30 mg by mouth daily.   Yes Historical Provider, MD  gabapentin (NEURONTIN) 800 MG tablet Take 800 mg by mouth 3 (three) times daily.   Yes Historical Provider, MD  montelukast (SINGULAIR) 10 MG tablet Take 1 tablet (10 mg total) by mouth at bedtime. 09/22/16  Yes Coralyn HellingVineet Sood, MD  valACYclovir (VALTREX) 500 MG tablet Take 1 tablet (500 mg total) by mouth 2 (two) times daily. 11/04/16  Yes Elvina SidleKurt Lauenstein, MD   Meds Ordered and Administered this Visit  Medications - No data to display  BP 128/70   Pulse 79   Temp 98.6 F (37 C) (Oral)   Resp 14   SpO2 98%  No data found.   Physical Exam  Constitutional: He is oriented to person, place, and time. He appears well-developed and well-nourished. No distress.  HENT:  Head: Normocephalic and atraumatic.  Lymphadenopathy:    He has no cervical adenopathy.  Neurological: He is alert and oriented to person, place, and time.  Skin: Skin is warm and dry. He is not diaphoretic.  0.5 cm (pea sized) freely moveable nodule to left parietal region of  the scalp. Appear superficial without signs of infection. No surrounding erythema or swelling. Non-tender to palpation.   Psychiatric: His behavior is normal.  Nursing note and vitals reviewed.   Urgent Care Course     Procedures (including critical care time)  Labs Review Labs Reviewed - No data to display  Imaging Review No results found.   Visual Acuity Review  Right Eye Distance:   Left Eye Distance:   Bilateral Distance:    Right Eye Near:   Left Eye Near:    Bilateral Near:         MDM   1. Nodule, subcutaneous    Explained that I could not provide a concrete diagnosis without a formal biopsy, but at this time it is  small, non-tender and superficial, suspect benign based on appearance,  but if changes in size or pain then suggest f/u with PCP for further recommendations.  Watch for now.    Riki Sheer, PA-C 12/10/16 1627

## 2017-03-06 ENCOUNTER — Other Ambulatory Visit: Payer: Self-pay | Admitting: Internal Medicine

## 2017-04-08 ENCOUNTER — Other Ambulatory Visit: Payer: Self-pay | Admitting: Internal Medicine

## 2017-05-28 ENCOUNTER — Ambulatory Visit: Payer: Self-pay | Admitting: Internal Medicine

## 2017-07-22 ENCOUNTER — Emergency Department (HOSPITAL_COMMUNITY)
Admission: EM | Admit: 2017-07-22 | Discharge: 2017-07-22 | Disposition: A | Discharge status: Home / Self Care | Payer: BC Managed Care – PPO | Attending: Emergency Medicine | Admitting: Emergency Medicine

## 2017-07-22 ENCOUNTER — Encounter (HOSPITAL_COMMUNITY): Payer: Self-pay | Admitting: Emergency Medicine

## 2017-07-22 DIAGNOSIS — Z79899 Other long term (current) drug therapy: Secondary | ICD-10-CM | POA: Insufficient documentation

## 2017-07-22 DIAGNOSIS — L0502 Pilonidal sinus with abscess: Secondary | ICD-10-CM | POA: Insufficient documentation

## 2017-07-22 DIAGNOSIS — L0501 Pilonidal cyst with abscess: Secondary | ICD-10-CM

## 2017-07-22 DIAGNOSIS — J4520 Mild intermittent asthma, uncomplicated: Secondary | ICD-10-CM | POA: Insufficient documentation

## 2017-07-22 MED ORDER — CEPHALEXIN 500 MG PO CAPS: 500.0000 mg | ORAL_CAPSULE | Freq: Four times a day (QID) | ORAL | 0 refills | Status: DC

## 2017-07-22 MED ORDER — SULFAMETHOXAZOLE-TRIMETHOPRIM 800-160 MG PO TABS
1.0000 | ORAL_TABLET | Freq: Once | ORAL | Status: DC
  Filled 2017-07-22: qty 1

## 2017-07-22 MED ORDER — KETOROLAC TROMETHAMINE 60 MG/2ML IM SOLN
60.0000 mg | Freq: Once | INTRAMUSCULAR | Status: AC
  Administered 2017-07-22: 60 mg via INTRAMUSCULAR
  Filled 2017-07-22: qty 2

## 2017-07-22 MED ORDER — ACETAMINOPHEN 325 MG PO TABS
650.0000 mg | ORAL_TABLET | Freq: Once | ORAL | Status: AC | PRN
  Administered 2017-07-22: 650 mg via ORAL
  Filled 2017-07-22: qty 2

## 2017-07-22 MED ORDER — CEPHALEXIN 500 MG PO CAPS
500.0000 mg | ORAL_CAPSULE | Freq: Once | ORAL | Status: AC
  Administered 2017-07-22: 500 mg via ORAL
  Filled 2017-07-22: qty 1

## 2017-07-22 MED ORDER — OXYCODONE-ACETAMINOPHEN 5-325 MG PO TABS: 1.0000 | ORAL_TABLET | ORAL | 0 refills | Status: DC | PRN

## 2017-07-22 MED ORDER — SULFAMETHOXAZOLE-TRIMETHOPRIM 800-160 MG PO TABS: 1.0000 | ORAL_TABLET | Freq: Two times a day (BID) | ORAL | 0 refills | Status: AC

## 2017-07-22 NOTE — Discharge Instructions (Signed)
The wound will continue to drain and the packing material will stay in place typically for the next 48-72 hours. The packing material may then be removed. The wound may then continue to drain thereafter. Keep the bandage in place to protect your clothing from drainage. Change the bandage whenever soiled or every 24 hours. Please take all of your antibiotics until finished!   You may develop abdominal discomfort or diarrhea from the antibiotic.  You may help offset this with probiotics which you can buy or get in yogurt. Do not eat or take the probiotics until 2 hours after your antibiotic.  Antiinflammatory medications: Take 600 mg of ibuprofen every 6 hours or 440 mg (over the counter dose) to 500 mg (prescription dose) of naproxen every 12 hours for the next 3 days. After this time, these medications may be used as needed for pain. Take these medications with food to avoid upset stomach. Choose only one of these medications, do not take them together. Tylenol: Should you continue to have additional pain while taking the ibuprofen or naproxen, you may add in tylenol as needed. Your daily total maximum amount of tylenol from all sources should be limited to /day for persons without liver problems, or /day for those with liver problems. Percocet: Take Percocet as needed for severe pain. Do not drive or perform other dangerous activities while taking the Percocet. Return to the ED or go to your primary care provider in 48-72 hours for a wound check to assure proper healing. After that, return to the ED should you have persistent fever, began to have vomiting, have significant increase in pain, or any other significant concerns. Should this abscess persist or recur, you may have to follow-up with general surgery.

## 2017-07-22 NOTE — ED Triage Notes (Signed)
Pt comes with complaints of a pilondial cyst that he first noticed last Wednesday.  Pt unable to sit down without discomfort.  Eating during triage and in no acute distress. Reports some drainage from area.

## 2017-07-22 NOTE — ED Provider Notes (Signed)
[facility]-EMERGENCY DEPT Provider Note   CSN: [id number] Arrival date & time: 07/22/17  1819     History   Chief Complaint Chief Complaint  Patient presents with  . Abscess    HPI Craig Wiley is a 26 y.o. male.  HPI   Craig Wiley is a 26 y.o. male, with a history of anxiety, presenting to the ED with painful area to the superior intergluteal cleft beginning around October 3. Pain has worsened. Patient has difficulty sitting due to pain. Has not had this issue before. Has had exposure to MRSA in the past. Denies known fever, nausea/vomiting, abdominal pain, pain with BMs, or any other complaints.      Past Medical History:  Diagnosis Date  . Anxiety   . GERD (gastroesophageal reflux disease)   . Mild persistent asthma   . Phimosis     Patient Active Problem List   Diagnosis Date Noted  . ADHD (attention deficit hyperactivity disorder), inattentive type 05/10/2016  . Substance-induced anxiety disorder (HCC) 05/10/2016  . Cannabis use disorder, severe, dependence (HCC) 05/09/2016  . Alcohol use disorder, severe, dependence (HCC) 05/09/2016  . Sedative, hypnotic or anxiolytic use disorder, severe, dependence (HCC) 05/09/2016  . Substance induced mood disorder (HCC) 05/09/2016  . Mild persistent asthma in adult without complication 10/14/2015    Past Surgical History:  Procedure Laterality Date  . CIRCUMCISION N/A 02/02/2016   Procedure: CIRCUMCISION ADULT;  Surgeon: Sebastian Ache, MD;  Location: [facility];  Service: Urology;  Laterality: N/A;  . CIRCUMCISION, NON-NEWBORN  01/2016  . NO PAST SURGERIES         Home Medications    Prior to Admission medications   Medication Sig Start Date End Date Taking? Authorizing Provider  albuterol (PROAIR HFA) 108 (90 Base) MCG/ACT inhaler Inhale 2 puffs into the lungs every 6 (six) hours as needed for wheezing or shortness of breath. 09/19/16   Jacalyn Lefevre, MD  cephALEXin (KEFLEX) 500 MG capsule  Take 1 capsule (500 mg total) by mouth 4 (four) times daily. 07/22/17   Stewart Sasaki C, PA-C  FLUoxetine (PROZAC) 10 MG capsule Take 30 mg by mouth daily.    [provider]  gabapentin (NEURONTIN) 800 MG tablet Take 800 mg by mouth 3 (three) times daily.    [provider]  montelukast (SINGULAIR) 10 MG tablet Take 1 tablet (10 mg total) by mouth at bedtime. 09/22/16   Coralyn Helling, MD  oxyCODONE-acetaminophen (PERCOCET/ROXICET) 5-325 MG tablet Take 1-2 tablets by mouth every 4 (four) hours as needed for severe pain. 07/22/17   Cydni Reddoch C, PA-C  sulfamethoxazole-trimethoprim (BACTRIM DS,SEPTRA DS) 800-160 MG tablet Take 1 tablet by mouth 2 (two) times daily. 07/22/17 07/29/17  Lason Eveland C, PA-C  SYMBICORT 160-4.5 MCG/ACT inhaler INHALE 2 PUFFS BY MOUTH EVERY MORNING AND 2 PUFFS ABOUT 12 HOURS LATER 04/09/17   Nyoka Cowden, MD  valACYclovir (VALTREX) 500 MG tablet Take 1 tablet (500 mg total) by mouth 2 (two) times daily. 11/04/16   Elvina Sidle, MD    Family History Family History  Problem Relation Age of Onset  . Deep vein thrombosis Mother   . Asthma Maternal Grandfather   . Alcohol abuse Maternal Grandfather   . Alcohol abuse Father   . Alcohol abuse Maternal Grandmother   . Alcohol abuse Paternal Grandfather   . Alcohol abuse Paternal Grandmother     Social History Social History  Substance Use Topics  . Smoking status: Never Smoker  . Smokeless tobacco:  Never Used  . Alcohol use No     Allergies   Patient has no known allergies.   Review of Systems Review of Systems  Constitutional: Negative for fever.  Skin: Positive for color change.       Painful area to the superior intergluteal cleft.     Physical Exam Updated Vital Signs BP 140/69 (BP Location: Left Arm)   Pulse (!) 107   Temp (!) 101.2 F (38.4 C) (Oral)   Resp 18   Ht  (1.753 m)   Wt 97.5 kg (215 lb)   SpO2 95%   BMI 31.75 kg/m   Physical Exam  Constitutional: He appears  well-developed and well-nourished.  Non-toxic appearance. He does not appear ill. No distress.  HENT:  Head: Normocephalic and atraumatic.  Eyes: Conjunctivae are normal.  Neck: Neck supple.  Cardiovascular: Normal rate and regular rhythm.   Pulmonary/Chest: Effort normal.  Neurological: He is alert.  Skin: Skin is warm and dry. He is not diaphoretic. No pallor.     Area of tenderness, erythema, and induration measuring approximately 5 x 3 cm in the superior intergluteal cleft. Some spontaneous purulent drainage noted, but only when pressure is applied.  Psychiatric: He has a normal mood and affect. His behavior is normal.  Nursing note and vitals reviewed.    ED Treatments / Results  Labs (all labs ordered are listed, but only abnormal results are displayed) Labs Reviewed - No data to display  EKG  EKG Interpretation None       Radiology No results found.  Procedures [place] bedside Date/Time: 07/22/2017 10:50 PM Performed by: Anselm Pancoast Authorized by: Harolyn Rutherford C  Consent: Verbal consent obtained. Risks and benefits: risks, benefits and alternatives were discussed Consent given by: patient Patient identity confirmed: verbally with patient and provided demographic data Local anesthesia used: yes Anesthesia: local infiltration  Anesthesia: Local anesthesia used: yes Local Anesthetic: bupivacaine 0.5% with epinephrine  Sedation: Patient sedated: no Patient tolerance: Patient tolerated the procedure well with no immediate complications Comments: Ultrasound performed of suspected abscess and skin infection. Abscess identified on ultrasound with surrounding cellulitis.  Marland Kitchen.Incision and Drainage Date/Time: 07/22/2017 10:55 PM Performed by: Anselm Pancoast Authorized by: Anselm Pancoast   Consent:    Consent obtained:  Verbal   Consent given by:  Patient   Risks discussed:  Bleeding, incomplete drainage and pain Location:    Type:  Abscess   Size:  5x3cm   Location:   Anogenital   Anogenital location:  Pilonidal Pre-procedure details:    Skin preparation:  Betadine Anesthesia (see MAR for exact dosages):    Anesthesia method:  Local infiltration   Local anesthetic:  Bupivacaine 0.5% WITH epi Procedure type:    Complexity:  Complex Procedure details:    Incision types:  Cruciate   Incision depth:  Subcutaneous   Scalpel blade:  11   Wound management:  Probed and deloculated, irrigated with saline and extensive cleaning   Drainage:  Purulent   Drainage amount:  Copious   Packing materials:  1/4 in iodoform gauze Post-procedure details:    Patient tolerance of procedure:  Tolerated well, no immediate complications   (including critical care time)  Medications Ordered in ED Medications  sulfamethoxazole-trimethoprim (BACTRIM DS,SEPTRA DS) 800-160 MG per tablet 1 tablet (not administered)  acetaminophen (TYLENOL) tablet 650 mg (650 mg Oral Given 07/22/17 1837)  ketorolac (TORADOL) injection 60 mg (60 mg Intramuscular Given 07/22/17 2235)  cephALEXin (KEFLEX) capsule 500 mg (  500 mg Oral Given 07/22/17 2329)     Initial Impression / Assessment and Plan / ED Course  I have reviewed the triage vital signs and the nursing notes.  Pertinent labs & imaging results that were available during my care of the patient were reviewed by me and considered in my medical decision making (see chart for details).     Patient presents with pilonidal abscess with surrounding cellulitis. I&D performed with copious drainage expressed. Antibiotics prescribed to include coverage for normal skin flora as well as MRSA. Patient to return in 2-3 days for wound check and packing removal. The patient was given instructions for home care as well as return precautions. Patient voices understanding of these instructions, accepts the plan, and is comfortable with discharge.     Final Clinical Impressions(s) / ED Diagnoses   Final diagnoses:  Pilonidal abscess    New  Prescriptions New Prescriptions   CEPHALEXIN (KEFLEX) 500 MG CAPSULE    Take 1 capsule (500 mg total) by mouth 4 (four) times daily.   OXYCODONE-ACETAMINOPHEN (PERCOCET/ROXICET) 5-325 MG TABLET    Take 1-2 tablets by mouth every 4 (four) hours as needed for severe pain.   SULFAMETHOXAZOLE-TRIMETHOPRIM (BACTRIM DS,SEPTRA DS) 800-160 MG TABLET    Take 1 tablet by mouth 2 (two) times daily.     Anselm Pancoast, PA-C 07/22/17 2337    Charlynne Pander, MD 07/24/17 [phone]

## 2017-07-25 ENCOUNTER — Other Ambulatory Visit: Payer: Self-pay | Admitting: Internal Medicine

## 2017-09-17 IMAGING — DX DG CHEST 2V
2 series · 2 of 2 positions shown · non-contrast
Comparison: None.

CLINICAL DATA: Shortness of breath for 10 days, history of asthma

EXAM:
CHEST  2 VIEW

[chest pa]
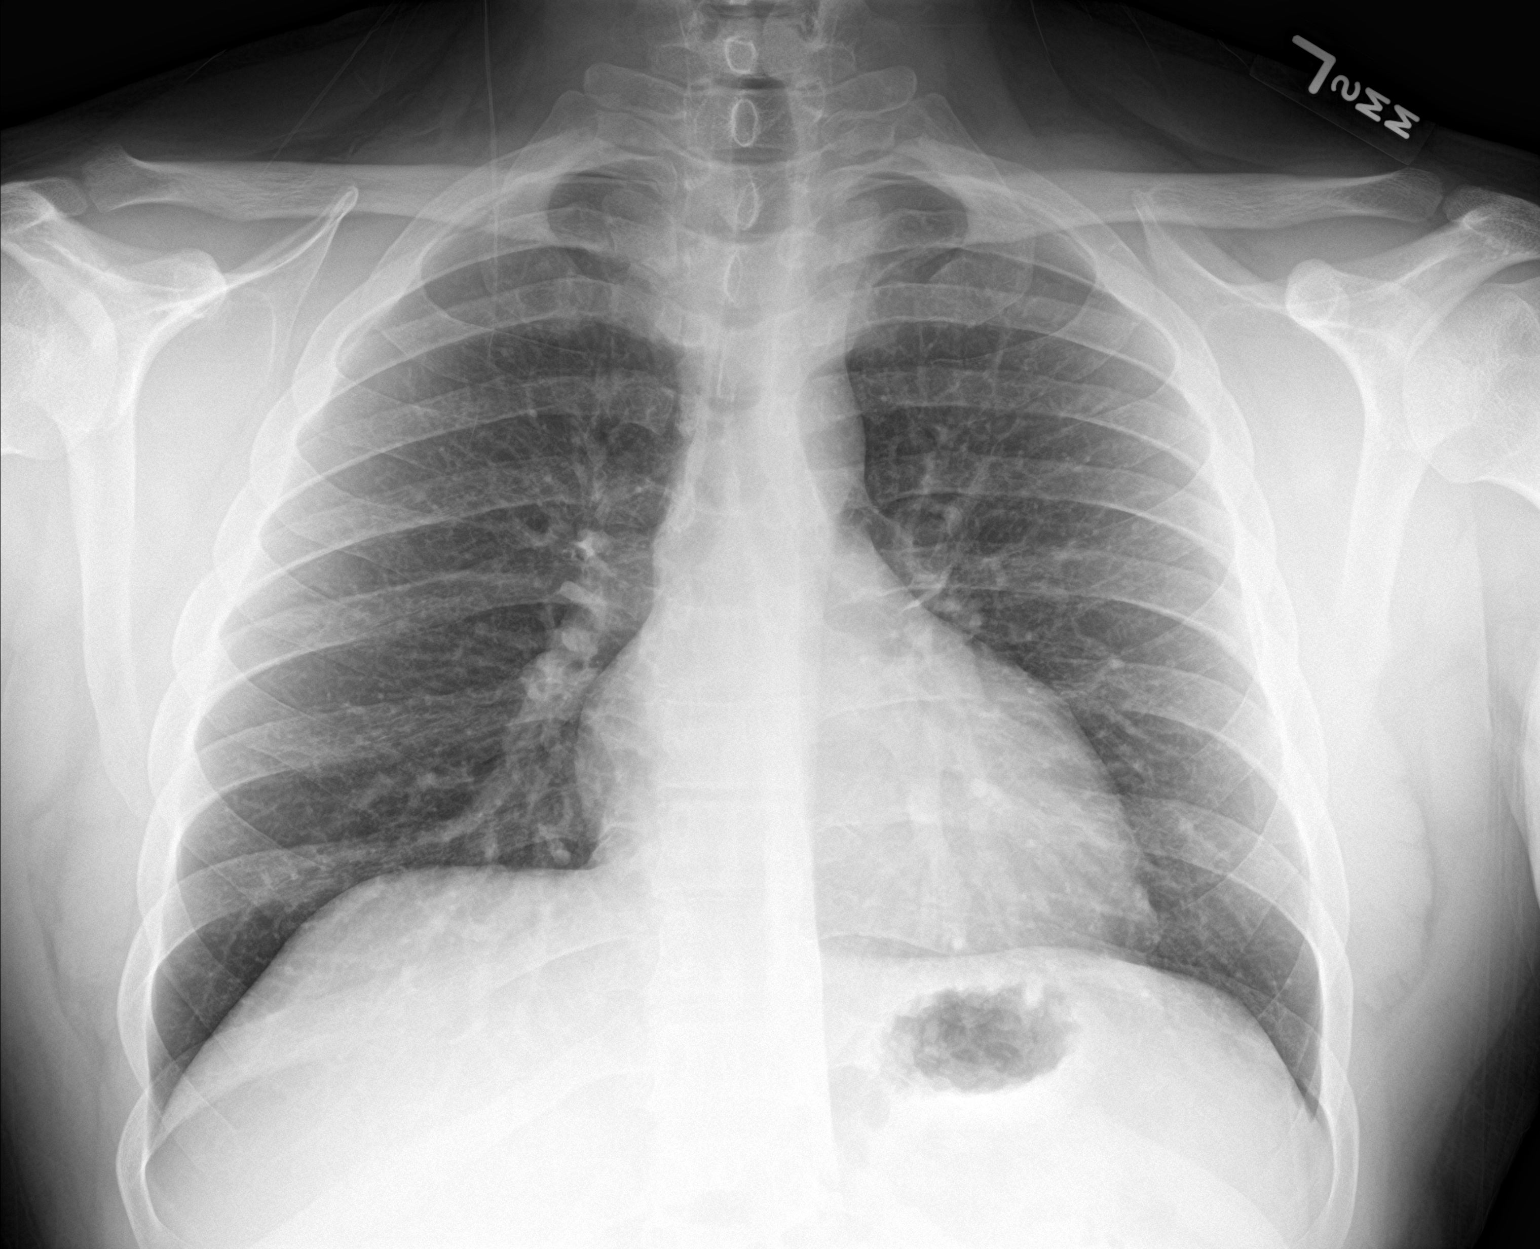

[chest lat]
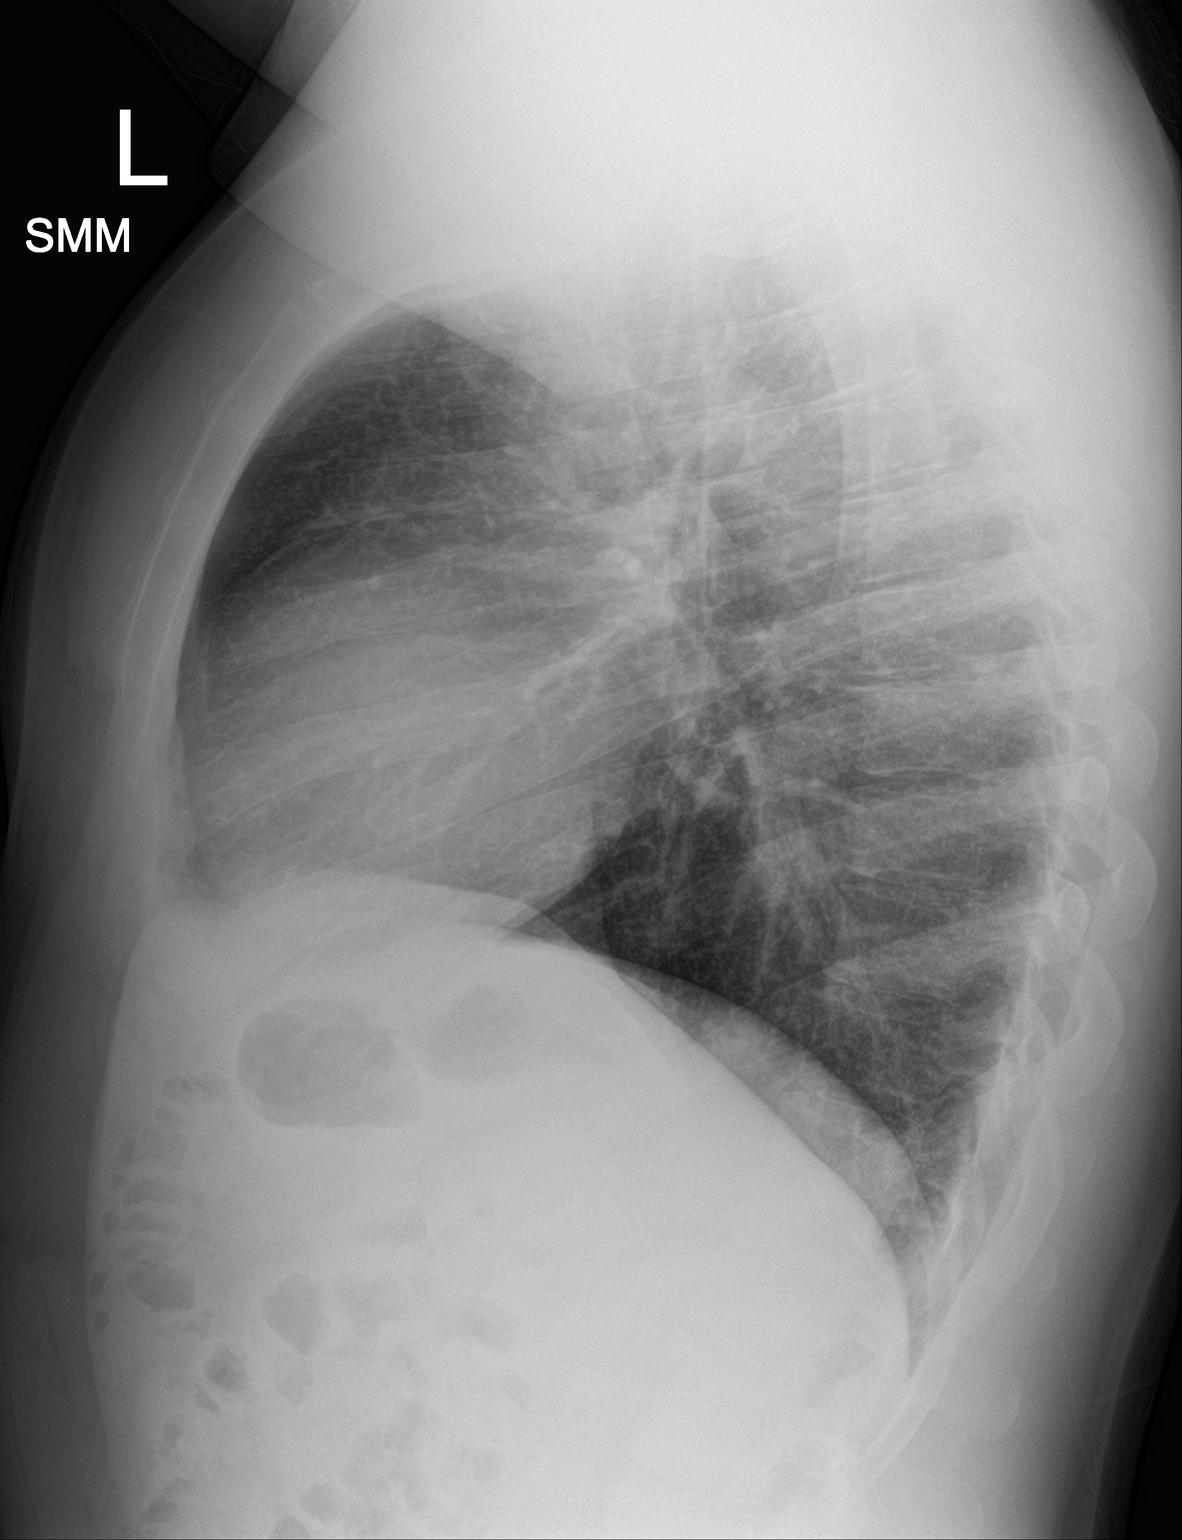

[2 of 2 positions shown; findings below may reference images not displayed]

FINDINGS: The heart size and mediastinal contours are within normal limits.
Both lungs are clear. The visualized skeletal structures are
unremarkable.
IMPRESSION: No active cardiopulmonary disease.

## 2018-04-28 ENCOUNTER — Encounter (HOSPITAL_COMMUNITY): Payer: Self-pay | Admitting: Emergency Medicine

## 2018-04-28 ENCOUNTER — Other Ambulatory Visit: Payer: Self-pay

## 2018-04-28 ENCOUNTER — Ambulatory Visit (HOSPITAL_COMMUNITY)
Admission: EM | Admit: 2018-04-28 | Discharge: 2018-04-28 | Disposition: A | Discharge status: Home / Self Care | Payer: BC Managed Care – PPO | Attending: Physician Assistant | Admitting: Physician Assistant

## 2018-04-28 DIAGNOSIS — J45909 Unspecified asthma, uncomplicated: Secondary | ICD-10-CM | POA: Diagnosis not present

## 2018-04-28 DIAGNOSIS — Z760 Encounter for issue of repeat prescription: Secondary | ICD-10-CM | POA: Diagnosis not present

## 2018-04-28 MED ORDER — BUDESONIDE-FORMOTEROL FUMARATE 160-4.5 MCG/ACT IN AERO: INHALATION_SPRAY | RESPIRATORY_TRACT | 11 refills | Status: AC

## 2018-04-28 NOTE — ED Provider Notes (Signed)
04/28/2018 10:22 AM   DOB: 04/25/1991 / MRN: [id number]  SUBJECTIVE:  Craig Wiley is a 27 y.o. male presenting for asthma refills.  Feels well today and denies any other complaints.  Symptoms controlled with medication.    He has No Known Allergies.   He  has a past medical history of Anxiety, GERD (gastroesophageal reflux disease), Mild persistent asthma, and Phimosis.    He  reports that he has never smoked. He has never used smokeless tobacco. He reports that he does not drink alcohol or use drugs. He  has no sexual activity history on file. The patient  has a past surgical history that includes No past surgeries; Circumcision (N/A, 02/02/2016); and Circumcision, non-newborn (01/2016).  His family history includes Alcohol abuse in his father, maternal grandfather, maternal grandmother, paternal grandfather, and paternal grandmother; Asthma in his maternal grandfather; Deep vein thrombosis in his mother.  Review of Systems  Constitutional: Negative for diaphoresis.  Respiratory: Negative for cough, hemoptysis, sputum production, shortness of breath and wheezing.   Cardiovascular: Negative for chest pain, orthopnea and leg swelling.  Gastrointestinal: Negative for nausea.  Neurological: Negative for dizziness.    OBJECTIVE:  BP 124/78 (BP Location: Left Arm)   Pulse 66   Temp 98.5 F (36.9 C) (Oral)   Resp 16   SpO2 100%   Wt Readings from Last 3 Encounters:  07/22/17 215 lb (97.5 kg)  11/27/16 223 lb (101.2 kg)  09/22/16 224 lb 12.8 oz (102 kg)   Temp Readings from Last 3 Encounters:  04/28/18 98.5 F (36.9 C) (Oral)  07/22/17 100 F (37.8 C) (Oral)  12/10/16 98.6 F (37 C) (Oral)   BP Readings from Last 3 Encounters:  04/28/18 124/78  07/22/17 106/62  12/10/16 128/70   Pulse Readings from Last 3 Encounters:  04/28/18 66  07/22/17 92  12/10/16 79    Physical Exam  Constitutional: He is oriented to person, place, and time. He appears well-developed. He does  not appear ill.  Eyes: Pupils are equal, round, and reactive to light. Conjunctivae and EOM are normal.  Cardiovascular: Normal rate, regular rhythm, S1 normal, S2 normal, normal heart sounds, intact distal pulses and normal pulses. Exam reveals no gallop and no friction rub.  No murmur heard. Pulmonary/Chest: Effort normal. No stridor. No respiratory distress. He has no wheezes. He has no rales.  Abdominal: He exhibits no distension.  Musculoskeletal: Normal range of motion. He exhibits no edema.  Neurological: He is alert and oriented to person, place, and time. No cranial nerve deficit. Coordination normal.  Skin: Skin is warm and dry. He is not diaphoretic.  Psychiatric: He has a normal mood and affect.  Nursing note and vitals reviewed.   No results found for this or any previous visit (from the past 72 hour(s)).  No results found.  ASSESSMENT AND PLAN:   Encounter for medication refill - Patient here for advair refills.  History of well controlled asthma on symbicort.    Discharge Instructions     Please continue all of your medications.         The patient is advised to call or return to clinic if he does not see an improvement in symptoms, or to seek the care of the closest emergency department if he worsens with the above plan.   Deliah BostonMichael Clark, MHS, PA-C 04/28/2018 10:22 AM   Ofilia Neaslark, Michael L, PA-C 04/28/18 1022

## 2018-04-28 NOTE — ED Triage Notes (Signed)
Patient is in the area for school.  Patient needing a refill of symbicort

## 2018-04-28 NOTE — Discharge Instructions (Addendum)
Please continue all of your medications.
